# Patient Record
Sex: Female | Born: 1975 | Race: White | Hispanic: No | Smoking: Never smoker
Health system: Southern US, Community
[De-identification: ages and names within clinical notes are randomized; demographics above are authoritative.]

## PROBLEM LIST (undated history)

## (undated) DIAGNOSIS — T7840XA Allergy, unspecified, initial encounter: Secondary | ICD-10-CM

## (undated) DIAGNOSIS — E079 Disorder of thyroid, unspecified: Secondary | ICD-10-CM

## (undated) DIAGNOSIS — F329 Major depressive disorder, single episode, unspecified: Secondary | ICD-10-CM

## (undated) DIAGNOSIS — F32A Depression, unspecified: Secondary | ICD-10-CM

## (undated) DIAGNOSIS — N809 Endometriosis, unspecified: Secondary | ICD-10-CM

## (undated) DIAGNOSIS — M4710 Other spondylosis with myelopathy, site unspecified: Secondary | ICD-10-CM

## (undated) DIAGNOSIS — F419 Anxiety disorder, unspecified: Secondary | ICD-10-CM

## (undated) DIAGNOSIS — G9529 Other cord compression: Secondary | ICD-10-CM

## (undated) DIAGNOSIS — G3189 Other specified degenerative diseases of nervous system: Secondary | ICD-10-CM

## (undated) HISTORY — DX: Anxiety disorder, unspecified: F41.9

## (undated) HISTORY — DX: Endometriosis, unspecified: N80.9

## (undated) HISTORY — DX: Other cord compression: G95.29

## (undated) HISTORY — DX: Other spondylosis with myelopathy, site unspecified: M47.10

## (undated) HISTORY — DX: Other specified degenerative diseases of nervous system: G31.89

## (undated) HISTORY — DX: Major depressive disorder, single episode, unspecified: F32.9

## (undated) HISTORY — DX: Depression, unspecified: F32.A

## (undated) HISTORY — DX: Disorder of thyroid, unspecified: E07.9

## (undated) HISTORY — DX: Allergy, unspecified, initial encounter: T78.40XA

---

## 2011-09-17 ENCOUNTER — Ambulatory Visit: Payer: Self-pay

## 2014-11-05 LAB — HM PAP SMEAR: HM PAP: NEGATIVE

## 2015-04-14 DIAGNOSIS — F329 Major depressive disorder, single episode, unspecified: Secondary | ICD-10-CM | POA: Insufficient documentation

## 2015-04-14 DIAGNOSIS — F410 Panic disorder [episodic paroxysmal anxiety] without agoraphobia: Secondary | ICD-10-CM | POA: Insufficient documentation

## 2016-05-13 DIAGNOSIS — M24852 Other specific joint derangements of left hip, not elsewhere classified: Secondary | ICD-10-CM | POA: Insufficient documentation

## 2017-06-09 ENCOUNTER — Other Ambulatory Visit: Payer: Self-pay | Admitting: Family Medicine

## 2017-06-09 DIAGNOSIS — Z1231 Encounter for screening mammogram for malignant neoplasm of breast: Secondary | ICD-10-CM

## 2017-10-13 ENCOUNTER — Encounter: Payer: Self-pay | Admitting: Physician Assistant

## 2017-10-13 ENCOUNTER — Ambulatory Visit (INDEPENDENT_AMBULATORY_CARE_PROVIDER_SITE_OTHER): Payer: BLUE CROSS/BLUE SHIELD | Admitting: Physician Assistant

## 2017-10-13 VITALS — BP 140/100 | HR 103 | Temp 98.4°F | Resp 16 | Ht 67.0 in | Wt 216.0 lb

## 2017-10-13 DIAGNOSIS — Z713 Dietary counseling and surveillance: Secondary | ICD-10-CM

## 2017-10-13 DIAGNOSIS — Z6833 Body mass index (BMI) 33.0-33.9, adult: Secondary | ICD-10-CM

## 2017-10-13 MED ORDER — PHENTERMINE HCL 37.5 MG PO TABS: 37.5000 mg | ORAL_TABLET | Freq: Every day | ORAL | 0 refills | Status: DC

## 2017-10-13 NOTE — Patient Instructions (Signed)

## 2017-10-13 NOTE — Progress Notes (Signed)
Patient: Casey Carter Female    DOB: 10/14/1975   42 y.o.   MRN: [id number] Visit Date: 10/13/2017  Today's Provider: Margaretann LovelessJennifer M Maiko Salais, PA-C   Chief Complaint  Patient presents with  . Establish Care   Subjective:    HPI Patient is coming in today to establish care and to talk about weight loss.  Obesity: Patient complains of obesity. Patient cites health, increased physical ability, self-image as reasons for wanting to lose weight. She reports that she goes to the gym 3 times a week. Reports that she taken Phentermine in past and it has been very successful. Reports that her diet is overall healthy. Reports that she tried calorie counting and was not successful.     Allergies  Allergen Reactions  . Lavender Oil Rash     Current Outpatient Medications:  .  buPROPion (WELLBUTRIN XL) 150 MG 24 hr tablet, , Disp: , Rfl: 4 .  escitalopram (LEXAPRO) 20 MG tablet, Take by mouth., Disp: , Rfl:  .  omeprazole (PRILOSEC) 40 MG capsule, , Disp: , Rfl: 3  Review of Systems  Constitutional: Positive for fatigue and unexpected weight change.  HENT: Positive for sinus pressure.   Eyes: Negative.   Respiratory: Negative.   Cardiovascular: Negative.   Gastrointestinal: Negative.   Endocrine: Negative.   Genitourinary: Negative.   Musculoskeletal: Negative.   Skin: Negative.   Allergic/Immunologic: Positive for environmental allergies.  Neurological: Positive for headaches.  Psychiatric/Behavioral: Positive for decreased concentration and sleep disturbance.   Depression screen PHQ 2/9 10/13/2017  Decreased Interest 1  Down, Depressed, Hopeless 1  PHQ - 2 Score 2  Altered sleeping 0  Tired, decreased energy 1  Change in appetite 0  Feeling bad or failure about yourself  0  Trouble concentrating 3  Moving slowly or fidgety/restless 0  Suicidal thoughts 0  PHQ-9 Score 6  Difficult doing work/chores Somewhat difficult     Social History   Tobacco Use  .  Smoking status: Never Smoker  . Smokeless tobacco: Never Used  Substance Use Topics  . Alcohol use: Yes    Alcohol/week: 4.2 oz    Types: 7 Glasses of wine per week    Frequency: Never   Objective:   BP (!) 140/100 (BP Location: Left Arm, Patient Position: Sitting, Cuff Size: Large)   Pulse (!) 103   Temp 98.4 F (36.9 C) (Oral)   Resp 16   Ht 5\' 7"  (1.702 m)   Wt 216 lb (98 kg)   SpO2 97%   BMI 33.83 kg/m    Physical Exam  Constitutional: She appears well-developed and well-nourished. No distress.  Neck: Normal range of motion. Neck supple.  Cardiovascular: Normal rate, regular rhythm and normal heart sounds. Exam reveals no gallop and no friction rub.  No murmur heard. Pulmonary/Chest: Effort normal and breath sounds normal. No respiratory distress. She has no wheezes. She has no rales.  Skin: She is not diaphoretic.  Psychiatric: She has a normal mood and affect. Her behavior is normal. Judgment and thought content normal.  Vitals reviewed.      Assessment & Plan:     1. Encounter to establish care  2. Encounter for weight loss counseling Has been trying dieting and slowly adding exercise. Will restart phentermine as below. Advised to start a food diary and try to stay between 1200-1300 calories daily. I will see her back in 4 weeks to check weight. - phentermine (ADIPEX-P) 37.5 MG tablet; Take  1 tablet (37.5 mg total) by mouth daily before breakfast.  Dispense: 30 tablet; Refill: 0  3. BMI 33.0-33.9,adult See above medical treatment plan. - phentermine (ADIPEX-P) 37.5 MG tablet; Take 1 tablet (37.5 mg total) by mouth daily before breakfast.  Dispense: 30 tablet; Refill: 0       Casey Loveless, PA-C  [facility]

## 2017-11-03 ENCOUNTER — Other Ambulatory Visit: Payer: Self-pay | Admitting: Physician Assistant

## 2017-11-03 DIAGNOSIS — Z713 Dietary counseling and surveillance: Secondary | ICD-10-CM

## 2017-11-03 DIAGNOSIS — Z6833 Body mass index (BMI) 33.0-33.9, adult: Secondary | ICD-10-CM

## 2017-11-04 ENCOUNTER — Ambulatory Visit (INDEPENDENT_AMBULATORY_CARE_PROVIDER_SITE_OTHER): Payer: BLUE CROSS/BLUE SHIELD | Admitting: Physician Assistant

## 2017-11-04 ENCOUNTER — Encounter: Payer: Self-pay | Admitting: Physician Assistant

## 2017-11-04 DIAGNOSIS — Z6833 Body mass index (BMI) 33.0-33.9, adult: Secondary | ICD-10-CM | POA: Diagnosis not present

## 2017-11-04 DIAGNOSIS — Z713 Dietary counseling and surveillance: Secondary | ICD-10-CM | POA: Diagnosis not present

## 2017-11-04 DIAGNOSIS — K219 Gastro-esophageal reflux disease without esophagitis: Secondary | ICD-10-CM | POA: Insufficient documentation

## 2017-11-04 MED ORDER — PHENTERMINE HCL 37.5 MG PO TABS: 37.5000 mg | ORAL_TABLET | Freq: Every day | ORAL | 2 refills | Status: DC

## 2017-11-04 NOTE — Progress Notes (Signed)
       Patient: Casey Carter Female    DOB: 03/04/1976   42 y.o.   MRN: [id number] Visit Date: 11/04/2017  Today's Provider: Margaretann LovelessJennifer M Burnette, PA-C   Chief Complaint  Patient presents with  . Follow-up   Subjective:    HPI  Follow up for weight loss counseling  The patient was last seen for this 4 weeks ago. Changes made at last visit include re start Phenternine.  She reports excellent compliance with treatment. She feels that condition is Improved. She is not having side effects.   Current Exercise Habits: Home exercise routine, Type of exercise: Other - see comments, Time (Minutes): 35, Frequency (Times/Week): 2, Weekly Exercise (Minutes/Week): 70, Intensity: Moderate    Wt Readings from Last 3 Encounters:  11/04/17 211 lb (95.7 kg)  10/13/17 216 lb (98 kg)    ------------------------------------------------------------------------------------     Allergies  Allergen Reactions  . Lavender Oil Rash     Current Outpatient Medications:  .  buPROPion (WELLBUTRIN XL) 150 MG 24 hr tablet, , Disp: , Rfl: 4 .  escitalopram (LEXAPRO) 20 MG tablet, Take by mouth., Disp: , Rfl:  .  omeprazole (PRILOSEC) 40 MG capsule, , Disp: , Rfl: 3 .  phentermine (ADIPEX-P) 37.5 MG tablet, Take 1 tablet (37.5 mg total) by mouth daily before breakfast., Disp: 30 tablet, Rfl: 0  Review of Systems  Constitutional: Negative.   Respiratory: Negative.   Cardiovascular: Negative.   Gastrointestinal: Negative.     Social History   Tobacco Use  . Smoking status: Never Smoker  . Smokeless tobacco: Never Used  Substance Use Topics  . Alcohol use: Yes    Alcohol/week: 4.2 oz    Types: 7 Glasses of wine per week    Frequency: Never   Objective:   BP 120/88 (BP Location: Left Arm, Patient Position: Sitting, Cuff Size: Large)   Pulse 93   Temp 98.4 F (36.9 C) (Oral)   Resp 16   Wt 211 lb (95.7 kg)   LMP 11/03/2017 (Exact Date)   SpO2 99%   BMI 33.05 kg/m  Vitals:   11/04/17 0829  BP: 120/88  Pulse: 93  Resp: 16  Temp: 98.4 F (36.9 C)  TempSrc: Oral  SpO2: 99%  Weight: 211 lb (95.7 kg)     Physical Exam  Constitutional: She appears well-developed and well-nourished. No distress.  Neck: Normal range of motion. Neck supple.  Cardiovascular: Normal rate, regular rhythm and normal heart sounds. Exam reveals no gallop and no friction rub.  No murmur heard. Pulmonary/Chest: Effort normal and breath sounds normal. No respiratory distress. She has no wheezes. She has no rales.  Skin: She is not diaphoretic.  Vitals reviewed.       Assessment & Plan:     1. Encounter for weight loss counseling Continue phentermine as below. I will see her back in 3 months for weight recheck. - phentermine (ADIPEX-P) 37.5 MG tablet; Take 1 tablet (37.5 mg total) by mouth daily before breakfast.  Dispense: 30 tablet; Refill: 2  2. BMI 33.0-33.9,adult See above medical treatment plan. - phentermine (ADIPEX-P) 37.5 MG tablet; Take 1 tablet (37.5 mg total) by mouth daily before breakfast.  Dispense: 30 tablet; Refill: 2       Margaretann LovelessJennifer M Burnette, PA-C  [facility]

## 2017-11-09 ENCOUNTER — Encounter: Payer: Self-pay | Admitting: Physician Assistant

## 2017-11-09 ENCOUNTER — Ambulatory Visit: Payer: BLUE CROSS/BLUE SHIELD | Admitting: Physician Assistant

## 2017-11-09 VITALS — BP 122/78 | HR 96 | Temp 98.4°F | Wt 208.2 lb

## 2017-11-09 DIAGNOSIS — E559 Vitamin D deficiency, unspecified: Secondary | ICD-10-CM | POA: Diagnosis not present

## 2017-11-09 DIAGNOSIS — R5383 Other fatigue: Secondary | ICD-10-CM | POA: Diagnosis not present

## 2017-11-09 DIAGNOSIS — K219 Gastro-esophageal reflux disease without esophagitis: Secondary | ICD-10-CM | POA: Diagnosis not present

## 2017-11-09 MED ORDER — OMEPRAZOLE 20 MG PO CPDR: 20.0000 mg | DELAYED_RELEASE_CAPSULE | Freq: Every day | ORAL | 0 refills | Status: DC

## 2017-11-09 MED ORDER — RANITIDINE HCL 150 MG PO TABS: 150.0000 mg | ORAL_TABLET | Freq: Every day | ORAL | 1 refills | Status: DC

## 2017-11-09 NOTE — Patient Instructions (Signed)

## 2017-11-09 NOTE — Progress Notes (Signed)
Patient: Casey Carter Female    DOB: 11/02/1975   42 y.o.   MRN: [id number] Visit Date: 11/09/2017  Today's Provider: Margaretann LovelessJennifer M Erique Kaser, PA-C   Chief Complaint  Patient presents with  . Fatigue   Subjective:    HPI Patient presents today with complaints of feeling fatigue. Patient states this is  recurrent, but worse this time. Patient reports she has a family history of thyroid disease. She also states she had some labs done with [facility] previously and her iron levels were possibly low. She started taking Iron with Vitamin C on Monday and reports she does not feel as fatigue since starting the medication.   She reports [facility] called her and told her she may need to decrease her Omeprazole dose as it may be causing malabsorption.    Allergies  Allergen Reactions  . Lavender Oil Rash     Current Outpatient Medications:  .  buPROPion (WELLBUTRIN XL) 150 MG 24 hr tablet, , Disp: , Rfl: 4 .  escitalopram (LEXAPRO) 20 MG tablet, Take by mouth., Disp: , Rfl:  .  omeprazole (PRILOSEC) 40 MG capsule, , Disp: , Rfl: 3 .  phentermine (ADIPEX-P) 37.5 MG tablet, Take 1 tablet (37.5 mg total) by mouth daily before breakfast., Disp: 30 tablet, Rfl: 2  Review of Systems  Constitutional: Positive for fatigue.  Respiratory: Negative.   Cardiovascular: Negative.     Social History   Tobacco Use  . Smoking status: Never Smoker  . Smokeless tobacco: Never Used  Substance Use Topics  . Alcohol use: Yes    Alcohol/week: 4.2 oz    Types: 7 Glasses of wine per week    Frequency: Never   Objective:   BP 122/78 (BP Location: Left Arm, Patient Position: Sitting, Cuff Size: Normal)   Pulse 96   Temp 98.4 F (36.9 C) (Oral)   Wt 208 lb 3.2 oz (94.4 kg)   LMP 11/03/2017 (Exact Date)   SpO2 98%   BMI 32.61 kg/m  Vitals:   11/09/17 1437  BP: 122/78  Pulse: 96  Temp: 98.4 F (36.9 C)  TempSrc: Oral  SpO2: 98%  Weight: 208 lb 3.2 oz (94.4 kg)     Physical Exam    Constitutional: She appears well-developed and well-nourished. No distress.  Neck: Normal range of motion. Neck supple.  Cardiovascular: Normal rate, regular rhythm and normal heart sounds. Exam reveals no gallop and no friction rub.  No murmur heard. Pulmonary/Chest: Effort normal and breath sounds normal. No respiratory distress. She has no wheezes. She has no rales.  Musculoskeletal: She exhibits no edema.  Skin: She is not diaphoretic.  Vitals reviewed.       Assessment & Plan:     1. Fatigue, unspecified type Unsure of cause but reports some improvement with taking oral iron supplement. I will check labs as below. I will f/u pending labs.  - CBC w/Diff/Platelet - Fe+TIBC+Fer - T4 AND TSH - Vitamin D (25 hydroxy) - B12  2. Gastroesophageal reflux disease without esophagitis Decrease omeprazole to 20mg  daily, then try to go to every other day. Add zantac as below nightly. Try to transition from omeprazole to zantac if possible.  - omeprazole (PRILOSEC) 20 MG capsule; Take 1 capsule (20 mg total) by mouth daily.  Dispense: 90 capsule; Refill: 0 - ranitidine (ZANTAC) 150 MG tablet; Take 1 tablet (150 mg total) by mouth at bedtime.  Dispense: 90 tablet; Refill: 1       Victorino DikeJennifer  Dorothy Puffer, PA-C  [facility]

## 2017-11-10 ENCOUNTER — Telehealth: Payer: Self-pay | Admitting: *Deleted

## 2017-11-10 DIAGNOSIS — E559 Vitamin D deficiency, unspecified: Secondary | ICD-10-CM | POA: Insufficient documentation

## 2017-11-10 LAB — CBC WITH DIFFERENTIAL/PLATELET
BASOS ABS: 0 10*3/uL (ref 0.0–0.2)
Basos: 0 %
EOS (ABSOLUTE): 0.1 10*3/uL (ref 0.0–0.4)
Eos: 1 %
HEMATOCRIT: 39.4 % (ref 34.0–46.6)
Hemoglobin: 13 g/dL (ref 11.1–15.9)
IMMATURE GRANULOCYTES: 0 %
Immature Grans (Abs): 0 10*3/uL (ref 0.0–0.1)
LYMPHS ABS: 1.8 10*3/uL (ref 0.7–3.1)
Lymphs: 21 %
MCH: 31.6 pg (ref 26.6–33.0)
MCHC: 33 g/dL (ref 31.5–35.7)
MCV: 96 fL (ref 79–97)
Monocytes Absolute: 0.5 10*3/uL (ref 0.1–0.9)
Monocytes: 5 %
NEUTROS PCT: 73 %
Neutrophils Absolute: 6 10*3/uL (ref 1.4–7.0)
PLATELETS: 393 10*3/uL — AB (ref 150–379)
RBC: 4.11 x10E6/uL (ref 3.77–5.28)
RDW: 14.7 % (ref 12.3–15.4)
WBC: 8.4 10*3/uL (ref 3.4–10.8)

## 2017-11-10 LAB — IRON,TIBC AND FERRITIN PANEL
Ferritin: 37 ng/mL (ref 15–150)
IRON: 131 ug/dL (ref 27–159)
Iron Saturation: 38 % (ref 15–55)
Total Iron Binding Capacity: 349 ug/dL (ref 250–450)
UIBC: 218 ug/dL (ref 131–425)

## 2017-11-10 LAB — VITAMIN D 25 HYDROXY (VIT D DEFICIENCY, FRACTURES): Vit D, 25-Hydroxy: 8.8 ng/mL — ABNORMAL LOW (ref 30.0–100.0)

## 2017-11-10 LAB — VITAMIN B12: Vitamin B-12: 459 pg/mL (ref 232–1245)

## 2017-11-10 LAB — T4 AND TSH
T4, Total: 5.8 ug/dL (ref 4.5–12.0)
TSH: 2.33 u[IU]/mL (ref 0.450–4.500)

## 2017-11-10 MED ORDER — VITAMIN D (ERGOCALCIFEROL) 1.25 MG (50000 UNIT) PO CAPS: 50000.0000 [IU] | ORAL_CAPSULE | ORAL | 1 refills | Status: DC

## 2017-11-10 NOTE — Addendum Note (Signed)
Addended by: Margaretann LovelessBURNETTE, Shalen Petrak M on: 11/10/2017 09:16 AM   Modules accepted: Orders

## 2017-11-10 NOTE — Telephone Encounter (Signed)
-----   Message from Margaretann LovelessJennifer M Burnette, [place]PA-C sent at 11/10/2017  9:15 AM EDT ----- All labs are normal with excpetion of Vit D which is very low at 8. Will send in high dose Vit D supplement for you.

## 2017-11-10 NOTE — Telephone Encounter (Signed)
LMOVM for pt to return call 

## 2017-11-10 NOTE — Telephone Encounter (Deleted)
Pt returned call. Please advise. Thanks TNP °

## 2017-11-18 NOTE — Telephone Encounter (Signed)
Patient had already been advised.  

## 2017-12-05 ENCOUNTER — Encounter: Payer: Self-pay | Admitting: Family Medicine

## 2017-12-05 ENCOUNTER — Ambulatory Visit: Payer: BLUE CROSS/BLUE SHIELD | Admitting: Family Medicine

## 2017-12-05 VITALS — BP 130/90 | HR 91 | Temp 98.5°F | Resp 16 | Wt 211.2 lb

## 2017-12-05 DIAGNOSIS — J069 Acute upper respiratory infection, unspecified: Secondary | ICD-10-CM

## 2017-12-05 MED ORDER — HYDROCODONE-HOMATROPINE 5-1.5 MG/5ML PO SYRP: 5.0000 mL | ORAL_SOLUTION | Freq: Four times a day (QID) | ORAL | 0 refills | Status: AC | PRN

## 2017-12-05 NOTE — Progress Notes (Signed)
  Subjective:     Patient ID: Casey Carter, female   DOB: [birth year], 42 y.o.   MRN: [id number] Chief Complaint  Patient presents with  . Cough    Patient comes in office today with complaints of productive cough that began a week ago. Patient reports that cough is productive of green phlegm and she complains of tightness in chest. Patient states associated with cough she has had fatigue, difficulty sleeping at night do to cough, left ear pressure and redness of both eyes. Patient has been taking otc Mucinex D, Zycam and Airborne.    HPI States he daughter has been sick as well. No fever reported but has had chills. Reports clear sinus congestion.  Review of Systems     Objective:   Physical Exam  Constitutional: She appears well-developed and well-nourished. No distress.  Ears: T.M's intact without inflammation Sinuses: non-tender Throat: no tonsillar enlargement or exudate Neck: no cervical adenopathy Lungs: clear     Assessment:    1. Viral upper respiratory tract infection: rx for Hycodan cough syrup sent in.     Plan:    Discussed continued use of otc medication. Work excuse for today.

## 2017-12-05 NOTE — Patient Instructions (Addendum)
Continue Mucinex and Decongestants like Sudafed. May use Delsym for cough. Call if sputum not clearing by Friday.

## 2018-01-23 ENCOUNTER — Other Ambulatory Visit: Payer: Self-pay | Admitting: Physician Assistant

## 2018-01-23 DIAGNOSIS — Z713 Dietary counseling and surveillance: Secondary | ICD-10-CM

## 2018-01-23 DIAGNOSIS — Z6833 Body mass index (BMI) 33.0-33.9, adult: Secondary | ICD-10-CM

## 2018-02-03 ENCOUNTER — Ambulatory Visit: Payer: BLUE CROSS/BLUE SHIELD | Admitting: Physician Assistant

## 2018-02-03 ENCOUNTER — Encounter: Payer: Self-pay | Admitting: Physician Assistant

## 2018-02-03 VITALS — BP 160/100 | HR 108 | Temp 98.0°F | Resp 16 | Ht 67.0 in | Wt 216.0 lb

## 2018-02-03 DIAGNOSIS — F329 Major depressive disorder, single episode, unspecified: Secondary | ICD-10-CM

## 2018-02-03 DIAGNOSIS — K219 Gastro-esophageal reflux disease without esophagitis: Secondary | ICD-10-CM | POA: Diagnosis not present

## 2018-02-03 DIAGNOSIS — E6609 Other obesity due to excess calories: Secondary | ICD-10-CM

## 2018-02-03 DIAGNOSIS — M62838 Other muscle spasm: Secondary | ICD-10-CM

## 2018-02-03 DIAGNOSIS — Z6833 Body mass index (BMI) 33.0-33.9, adult: Secondary | ICD-10-CM

## 2018-02-03 DIAGNOSIS — G9529 Other cord compression: Secondary | ICD-10-CM

## 2018-02-03 DIAGNOSIS — M4710 Other spondylosis with myelopathy, site unspecified: Secondary | ICD-10-CM

## 2018-02-03 DIAGNOSIS — F32A Depression, unspecified: Secondary | ICD-10-CM

## 2018-02-03 DIAGNOSIS — G3189 Other specified degenerative diseases of nervous system: Secondary | ICD-10-CM

## 2018-02-03 MED ORDER — LORCASERIN HCL 10 MG PO TABS: 10.0000 mg | ORAL_TABLET | Freq: Two times a day (BID) | ORAL | 0 refills | Status: DC

## 2018-02-03 MED ORDER — METHOCARBAMOL 500 MG PO TABS: 500.0000 mg | ORAL_TABLET | Freq: Three times a day (TID) | ORAL | 1 refills | Status: DC | PRN

## 2018-02-03 MED ORDER — ESCITALOPRAM OXALATE 20 MG PO TABS: 20.0000 mg | ORAL_TABLET | Freq: Every day | ORAL | 1 refills | Status: DC

## 2018-02-03 MED ORDER — PANTOPRAZOLE SODIUM 40 MG PO TBEC: 40.0000 mg | DELAYED_RELEASE_TABLET | Freq: Every day | ORAL | 1 refills | Status: DC

## 2018-02-03 MED ORDER — BUPROPION HCL ER (XL) 150 MG PO TB24: 150.0000 mg | ORAL_TABLET | Freq: Every day | ORAL | 1 refills | Status: DC

## 2018-02-03 NOTE — Progress Notes (Signed)
Patient: Casey Carter Female    DOB: 1976-07-12   42 y.o.   MRN: [id number] Visit Date: 02/03/2018  Today's Provider: Margaretann Loveless, PA-C   Chief Complaint  Patient presents with  . Follow-up    Weight Loss Counseling   Subjective:    HPI  Weight Loss Counseling, Follow up:  The patient was last seen for weight loss counseling 3 months ago. Changes made since that visit include none, continue Phentermine. Feels like is not helping with her appetite but it gives her energy.  She reports excellent compliance with treatment. She is not having side effects.   She is trying to exercising. She reports that she has DDD and it limits her on what to do. ------------------------------------------------------------------------   GERD, Follow up: Patient is currently taking Zantac and Omeprazole and reports medications are not helping at all.  She reports excellent compliance with treatment. She is not having side effects.  She IS experiencing cough, deep pressure at base of neck, fullness after meals, heartburn, need to clear throat frequently and nocturnal burning. Has a lot of stress with work. She is an Occupational hygienist.  ------------------------------------------------------------------------    Allergies  Allergen Reactions  . Lavender Oil Rash     Current Outpatient Medications:  .  buPROPion (WELLBUTRIN XL) 150 MG 24 hr tablet, , Disp: , Rfl: 4 .  escitalopram (LEXAPRO) 20 MG tablet, Take by mouth., Disp: , Rfl:  .  omeprazole (PRILOSEC) 20 MG capsule, Take 1 capsule (20 mg total) by mouth daily., Disp: 90 capsule, Rfl: 0 .  ranitidine (ZANTAC) 150 MG tablet, Take 1 tablet (150 mg total) by mouth at bedtime., Disp: 90 tablet, Rfl: 1 .  Vitamin D, Ergocalciferol, (DRISDOL) 50000 units CAPS capsule, Take 1 capsule (50,000 Units total) by mouth every 7 (seven) days., Disp: 12 capsule, Rfl: 1 .  phentermine (ADIPEX-P) 37.5 MG tablet, Take 1 tablet (37.5 mg total)  by mouth daily before breakfast. (Patient not taking: Reported on 02/03/2018), Disp: 30 tablet, Rfl: 2  Review of Systems  Constitutional: Positive for fatigue.  Respiratory: Negative.   Cardiovascular: Negative.   Gastrointestinal: Positive for abdominal distention and nausea.  Neurological: Negative.   Psychiatric/Behavioral: Positive for agitation and dysphoric mood. The patient is nervous/anxious.     Social History   Tobacco Use  . Smoking status: Never Smoker  . Smokeless tobacco: Never Used  Substance Use Topics  . Alcohol use: Yes    Alcohol/week: 4.2 oz    Types: 7 Glasses of wine per week    Frequency: Never   Objective:   BP (!) 160/100 (BP Location: Left Arm, Patient Position: Sitting, Cuff Size: Large)   Pulse (!) 108   Temp 98 F (36.7 C) (Oral)   Resp 16   Ht 5\' 7"  (1.702 m)   Wt 216 lb (98 kg)   LMP 01/20/2018 Comment: really heavy first 2 days then spotting. On the first day she went through a whole box of tampons.  SpO2 98%   BMI 33.83 kg/m  Vitals:   02/03/18 0900  BP: (!) 160/100  Pulse: (!) 108  Resp: 16  Temp: 98 F (36.7 C)  TempSrc: Oral  SpO2: 98%  Weight: 216 lb (98 kg)  Height: 5\' 7"  (1.702 m)     Physical Exam  Constitutional: She appears well-developed and well-nourished. No distress.  Neck: Normal range of motion. Neck supple.  Cardiovascular: Normal rate, regular rhythm and normal heart sounds.  Exam reveals no gallop and no friction rub.  No murmur heard. Pulmonary/Chest: Effort normal and breath sounds normal. No respiratory distress. She has no wheezes. She has no rales.  Abdominal: Soft. Bowel sounds are normal. She exhibits no distension and no mass. There is tenderness in the epigastric area. There is no guarding.  Skin: She is not diaphoretic.  Psychiatric: Her speech is normal and behavior is normal. Judgment and thought content normal. Her mood appears anxious. Cognition and memory are normal.  Vitals reviewed.        Assessment & Plan:     1. Class 1 obesity due to excess calories without serious comorbidity with body mass index (BMI) of 33.0 to 33.9 in adult Failed phentermine x 3 months. Will try Belviq as below. Contrave contraindicated due to patient already being on wellbutrin and lexapro for depression and anxiety.Disucssed food diary, meal prepping and increasing physical activity.  I will see her back in 3 months.  - Lorcaserin HCl 10 MG TABS; Take 10 mg by mouth 2 (two) times daily.  Dispense: 60 tablet; Refill: 0  2. Gastroesophageal reflux disease without esophagitis Worsening. Will change omeprazole to pantoprazole. Continue zantac BID prn. I will see her back in 3 months.  - pantoprazole (PROTONIX) 40 MG tablet; Take 1 tablet (40 mg total) by mouth daily.  Dispense: 90 tablet; Refill: 1  3. Acute depression Stable. Diagnosis pulled for medication refill. Continue current medical treatment plan. - escitalopram (LEXAPRO) 20 MG tablet; Take 1 tablet (20 mg total) by mouth daily.  Dispense: 90 tablet; Refill: 1 - buPROPion (WELLBUTRIN XL) 150 MG 24 hr tablet; Take 1 tablet (150 mg total) by mouth daily.  Dispense: 90 tablet; Refill: 1  4. Muscle spasm Secondary to DDD. Patient reports trying PT without relief. Also patient reports being referred to spinal specialist, waited in waiting room for over and hour and a half without being seen so she left. Will give muscle relaxer for occasional muscle spasm as below. Call if worsening and will refer.  - methocarbamol (ROBAXIN) 500 MG tablet; Take 1 tablet (500 mg total) by mouth every 8 (eight) hours as needed for muscle spasms.  Dispense: 90 tablet; Refill: 1  5. 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 - methocarbamol (ROBAXIN) 500 MG tablet; Take 1 tablet (500 mg total) by mouth every 8 (eight) hours as needed for muscle spasms.  Dispense: 90 tablet; Refill: 1  I spent  approximately 40 minutes with the patient today. Over 50% of this time was spent with counseling and educating the patient.       Margaretann LovelessJennifer M Ysidro Ramsay, PA-C  [facility]

## 2018-02-03 NOTE — Patient Instructions (Signed)
Pantoprazole tablets What is this medicine? PANTOPRAZOLE (pan TOE pra zole) prevents the production of acid in the stomach. It is used to treat gastroesophageal reflux disease (GERD), inflammation of the esophagus, and Zollinger-Ellison syndrome. This medicine may be used for other purposes; ask your health care provider or pharmacist if you have questions. COMMON BRAND NAME(S): Protonix What should I tell my health care provider before I take this medicine? They need to know if you have any of these conditions: -liver disease -low levels of magnesium in the blood -lupus -an unusual or allergic reaction to omeprazole, lansoprazole, pantoprazole, rabeprazole, other medicines, foods, dyes, or preservatives -pregnant or trying to get pregnant -breast-feeding How should I use this medicine? Take this medicine by mouth. Swallow the tablets whole with a drink of water. Follow the directions on the prescription label. Do not crush, break, or chew. Take your medicine at regular intervals. Do not take your medicine more often than directed. Talk to your pediatrician regarding the use of this medicine in children. While this drug may be prescribed for children as young as 5 years for selected conditions, precautions do apply. Overdosage: If you think you have taken too much of this medicine contact a poison control center or emergency room at once. NOTE: This medicine is only for you. Do not share this medicine with others. What if I miss a dose? If you miss a dose, take it as soon as you can. If it is almost time for your next dose, take only that dose. Do not take double or extra doses. What may interact with this medicine? Do not take this medicine with any of the following medications: -atazanavir -nelfinavir This medicine may also interact with the following medications: -ampicillin -delavirdine -erlotinib -iron salts -medicines for fungal infections like ketoconazole, itraconazole and  voriconazole -methotrexate -mycophenolate mofetil -warfarin This list may not describe all possible interactions. Give your health care provider a list of all the medicines, herbs, non-prescription drugs, or dietary supplements you use. Also tell them if you smoke, drink alcohol, or use illegal drugs. Some items may interact with your medicine. What should I watch for while using this medicine? It can take several days before your stomach pain gets better. Check with your doctor or health care professional if your condition does not start to get better, or if it gets worse. You may need blood work done while you are taking this medicine. What side effects may I notice from receiving this medicine? Side effects that you should report to your doctor or health care professional as soon as possible: -allergic reactions like skin rash, itching or hives, swelling of the face, lips, or tongue -bone, muscle or joint pain -breathing problems -chest pain or chest tightness -dark yellow or brown urine -dizziness -fast, irregular heartbeat -feeling faint or lightheaded -fever or sore throat -muscle spasm -palpitations -rash on cheeks or arms that gets worse in the sun -redness, blistering, peeling or loosening of the skin, including inside the mouth -seizures -tremors -unusual bleeding or bruising -unusually weak or tired -yellowing of the eyes or skin Side effects that usually do not require medical attention (report to your doctor or health care professional if they continue or are bothersome): -constipation -diarrhea -dry mouth -headache -nausea This list may not describe all possible side effects. Call your doctor for medical advice about side effects. You may report side effects to FDA at 1-800-FDA-1088. Where should I keep my medicine? Keep out of the reach of children. Store at room  temperature between 15 and 30 degrees C (59 and 86 degrees F). Protect from light and moisture. Throw  away any unused medicine after the expiration date. NOTE: This sheet is a summary. It may not cover all possible information. If you have questions about this medicine, talk to your doctor, pharmacist, or health care provider.  2018 Elsevier/Gold Standard (2015-08-14 12:20:19) Lorcaserin oral tablets What is this medicine? LORCASERIN (lor ca SER in) is used to promote and maintain weight loss in obese patients. This medicine should be used with a reduced calorie diet and, if appropriate, an exercise program. This medicine may be used for other purposes; ask your health care provider or pharmacist if you have questions. COMMON BRAND NAME(S): Belviq What should I tell my health care provider before I take this medicine? They need to know if you have any of these conditions: -anatomical deformation of the penis, Peyronie's disease, or history of priapism (painful and prolonged erection) -diabetes -heart disease -history of blood diseases, like sickle cell anemia or leukemia -history of irregular heartbeat -kidney disease -liver disease -suicidal thoughts, plans, or attempt; a previous suicide attempt by you or a family member -an unusual or allergic reaction to lorcaserin, other medicines, foods, dyes, or preservatives -pregnant or trying to get pregnant -breast-feeding How should I use this medicine? Take this medicine by mouth with a glass of water. Follow the directions on the prescription label. You can take it with or without food. Take your medicine at regular intervals. Do not take it more often than directed. Do not stop taking except on your doctor's advice. Talk to your pediatrician regarding the use of this medicine in children. Special care may be needed. Overdosage: If you think you have taken too much of this medicine contact a poison control center or emergency room at once. NOTE: This medicine is only for you. Do not share this medicine with others. What if I miss a dose? If  you miss a dose, take it as soon as you can. If it is almost time for your next dose, take only that dose. Do not take double or extra doses. What may interact with this medicine? -cabergoline -certain medicines for depression, anxiety, or psychotic disturbances -certain medicines for erectile dysfunction -certain medicines for migraine headache like almotriptan, eletriptan, frovatriptan, naratriptan, rizatriptan, sumatriptan, zolmitriptan -dextromethorphan -linezolid -lithium -medicines for diabetes -other weight loss products -tramadol -St. John's Wort -stimulant medicines for attention disorders, weight loss, or to stay awake -tryptophan This list may not describe all possible interactions. Give your health care provider a list of all the medicines, herbs, non-prescription drugs, or dietary supplements you use. Also tell them if you smoke, drink alcohol, or use illegal drugs. Some items may interact with your medicine. What should I watch for while using this medicine? This medicine is intended to be used in addition to a healthy diet and appropriate exercise. The best results are achieved this way. Your doctor should instruct you to stop taking this medicine if you do not lose a certain amount of weight within the first 12 weeks of treatment, but it is important that you do not change your dose in any way without consulting your doctor or health care professional. Visit your doctor or health care professional for regular checkups. Your doctor may order blood tests or other tests to see how you are doing. Do not drive, use machinery, or do anything that needs mental alertness until you know how this medicine affects you. This medicine may affect  blood sugar levels. If you have diabetes, check with your doctor or health care professional before you change your diet or the dose of your diabetic medicine. Patients and their families should watch out for worsening depression or thoughts of  suicide. Also watch out for sudden changes in feelings such as feeling anxious, agitated, panicky, irritable, hostile, aggressive, impulsive, severely restless, overly excited and hyperactive, or not being able to sleep. If this happens, especially at the beginning of treatment or after a change in dose, call your health care professional. Contact your doctor or health care professional right away if you are a man with an erection that lasts longer than 4 hours or if the erection becomes painful. This may be a sign of serious problem and must be treated right away to prevent permanent damage. What side effects may I notice from receiving this medicine? Side effects that you should report to your doctor or health care professional as soon as possible: -allergic reactions like skin rash, itching or hives, swelling of the face, lips, or tongue -abnormal production of milk -breast enlargement in both males and females -breathing problems -changes in emotions or moods -changes in vision -confusion -erection lasting more than 4 hours or a painful erection -fast or irregular heart beat -feeling faint or lightheaded, falls -fever or chills, sore throat -hallucination, loss of contact with reality -high or low blood pressure -menstrual changes -restlessness -slow or irregular heartbeat -stiff muscles -sweating -suicidal thoughts or other mood changes -swelling of the ankles, feet, hands -unusually weak or tired -vomiting Side effects that usually do not require medical attention (report to your doctor or health care professional if they continue or are bothersome): -back pain -constipation -cough -dry mouth -nausea -tiredness This list may not describe all possible side effects. Call your doctor for medical advice about side effects. You may report side effects to FDA at 1-800-FDA-1088. Where should I keep my medicine? Keep out of the reach of children. This medicine can be abused. Keep  your medicine in a safe place to protect it from theft. Do not share this medicine with anyone. Selling or giving away this medicine is dangerous and against the law. Store at room temperature between 15 and 30 degrees C (59 and 86 degrees F). Throw away any unused medicine after the expiration date. NOTE: This sheet is a summary. It may not cover all possible information. If you have questions about this medicine, talk to your doctor, pharmacist, or health care provider.  2018 Elsevier/Gold Standard (2015-08-14 12:13:31)

## 2018-03-15 ENCOUNTER — Ambulatory Visit: Payer: BLUE CROSS/BLUE SHIELD | Admitting: Family Medicine

## 2018-03-15 ENCOUNTER — Encounter: Payer: Self-pay | Admitting: Family Medicine

## 2018-03-15 VITALS — BP 140/91 | HR 110 | Temp 98.4°F | Resp 16 | Wt 211.0 lb

## 2018-03-15 DIAGNOSIS — S300XXA Contusion of lower back and pelvis, initial encounter: Secondary | ICD-10-CM

## 2018-03-15 MED ORDER — PREDNISONE 20 MG PO TABS: 20.0000 mg | ORAL_TABLET | Freq: Two times a day (BID) | ORAL | 0 refills | Status: AC

## 2018-03-15 MED ORDER — HYDROCODONE-ACETAMINOPHEN 7.5-325 MG PO TABS: 1.0000 | ORAL_TABLET | Freq: Four times a day (QID) | ORAL | 0 refills | Status: DC | PRN

## 2018-03-15 MED ORDER — HYDROCODONE-ACETAMINOPHEN 7.5-325 MG PO TABS
1.0000 | ORAL_TABLET | Freq: Four times a day (QID) | ORAL | 0 refills | Status: AC | PRN
Start: 2018-03-15 — End: 2018-03-20

## 2018-03-15 NOTE — Progress Notes (Signed)
Patient: Casey Carter Female    DOB: 08/13/1975   42 y.o.   MRN: [id number] Visit Date: 03/15/2018  Today's Provider: Mila Merryonald Revonda Menter, MD   Chief Complaint  Patient presents with  . Back Pain   Subjective:    HPI  Patient states she fell backswords yesterday and landed on an entertainment center. Patient tripped over another piece of furniture causing her to fall. Patient states when she fell she hit the lower region of her back. Patient states she is sore in back and neck. Hurt a little bit right after accident, but much worse overnight. Patient states she has been taking her prescribed muscle relaxer but it is not helping with the pain. She has also been taking 4 x 200mg  ibuprofen which is starting to upset her stomach. She has been alternating applications of heat and ice without improvement. No numbness tingling, or weakness of LEs.    Allergies  Allergen Reactions  . Lavender Oil Rash     Current Outpatient Medications:  .  buPROPion (WELLBUTRIN XL) 150 MG 24 hr tablet, Take 1 tablet (150 mg total) by mouth daily., Disp: 90 tablet, Rfl: 1 .  escitalopram (LEXAPRO) 20 MG tablet, Take 1 tablet (20 mg total) by mouth daily., Disp: 90 tablet, Rfl: 1 .  methocarbamol (ROBAXIN) 500 MG tablet, Take 1 tablet (500 mg total) by mouth every 8 (eight) hours as needed for muscle spasms., Disp: 90 tablet, Rfl: 1 .  pantoprazole (PROTONIX) 40 MG tablet, Take 1 tablet (40 mg total) by mouth daily., Disp: 90 tablet, Rfl: 1 .  ranitidine (ZANTAC) 150 MG tablet, Take 1 tablet (150 mg total) by mouth at bedtime., Disp: 90 tablet, Rfl: 1 .  Vitamin D, Ergocalciferol, (DRISDOL) 50000 units CAPS capsule, Take 1 capsule (50,000 Units total) by mouth every 7 (seven) days., Disp: 12 capsule, Rfl: 1 .  Lorcaserin HCl 10 MG TABS, Take 10 mg by mouth 2 (two) times daily. (Patient not taking: Reported on 03/15/2018), Disp: 60 tablet, Rfl: 0  Review of Systems  Constitutional: Negative for appetite  change, chills, fatigue and fever.  Respiratory: Negative for chest tightness and shortness of breath.   Cardiovascular: Negative for chest pain and palpitations.  Gastrointestinal: Negative for abdominal pain, nausea and vomiting.  Musculoskeletal: Positive for back pain and neck pain.  Neurological: Negative for dizziness and weakness.    Social History   Tobacco Use  . Smoking status: Never Smoker  . Smokeless tobacco: Never Used  Substance Use Topics  . Alcohol use: Yes    Alcohol/week: 7.0 standard drinks    Types: 7 Glasses of wine per week    Frequency: Never   Objective:   BP (!) 140/91 (BP Location: Right Arm, Patient Position: Sitting, Cuff Size: Large)   Pulse (!) 110   Temp 98.4 F (36.9 C) (Oral)   Resp 16   Wt 211 lb (95.7 kg)   SpO2 98%   BMI 33.05 kg/m  Vitals:   03/15/18 1528  BP: (!) 140/91  Pulse: (!) 110  Resp: 16  Temp: 98.4 F (36.9 C)  TempSrc: Oral  SpO2: 98%  Weight: 211 lb (95.7 kg)     Physical Exam  General appearance: alert, well developed, well nourished, cooperative and in no distress Head: Normocephalic, without obvious abnormality, atraumatic Respiratory: Respirations even and unlabored, normal respiratory rate Extremities: Very tender over sacrum an iliac spine. No gross deformities. No LE weakness or other neuro deficits. Able  to stand and ambulate without assistance, but in some pain      Assessment & Plan:     1. Contusion of sacrococcygeal region, initial encounter She is not tolerating high doses of NSAIDs, will start prednisone and hydrocodone/apap prn. Counseled to ice frequently over the next 48 hours, then alternate heat and ice. Use soft cushions to sit on. Expect to get worse before getting better, but should start to improve in 2-3 days and should then improve steadily over the next 3-4 weeks. Call or ROV otherwise.        Mila Merryonald Adrina Armijo, MD  [facility]

## 2018-03-16 ENCOUNTER — Ambulatory Visit: Payer: BLUE CROSS/BLUE SHIELD | Admitting: Physician Assistant

## 2018-03-28 ENCOUNTER — Ambulatory Visit: Payer: BLUE CROSS/BLUE SHIELD | Admitting: Physician Assistant

## 2018-03-28 ENCOUNTER — Encounter: Payer: Self-pay | Admitting: Physician Assistant

## 2018-03-28 ENCOUNTER — Ambulatory Visit
Admission: RE | Admit: 2018-03-28 | Discharge: 2018-03-28 | Disposition: A | Discharge status: Home / Self Care | Payer: BLUE CROSS/BLUE SHIELD | Source: Ambulatory Visit | Attending: Physician Assistant | Admitting: Physician Assistant

## 2018-03-28 VITALS — BP 150/90 | HR 98 | Temp 98.4°F | Resp 16 | Wt 208.2 lb

## 2018-03-28 DIAGNOSIS — M25552 Pain in left hip: Secondary | ICD-10-CM | POA: Diagnosis not present

## 2018-03-28 DIAGNOSIS — M5416 Radiculopathy, lumbar region: Secondary | ICD-10-CM | POA: Diagnosis present

## 2018-03-28 DIAGNOSIS — M4726 Other spondylosis with radiculopathy, lumbar region: Secondary | ICD-10-CM | POA: Diagnosis not present

## 2018-03-28 MED ORDER — OXYCODONE-ACETAMINOPHEN 5-325 MG PO TABS: 1.0000 | ORAL_TABLET | Freq: Three times a day (TID) | ORAL | 0 refills | Status: DC | PRN

## 2018-03-28 NOTE — Progress Notes (Signed)
Patient: Casey Carter Female    DOB: April 27, 1976   42 y.o.   MRN: [id number] Visit Date: 03/28/2018  Today's Provider: Margaretann Loveless, PA-C   No chief complaint on file.  Subjective:    Back Pain  This is a chronic problem. The problem occurs constantly. The pain is present in the lumbar spine (Lower back). The quality of the pain is described as aching and cramping. Radiates to: Radiates down on her legs more to the left side. Pain scale: Depends on the day and movements. The pain is moderate. Exacerbated by: "Movement" Stiffness is present in the morning. Associated symptoms include leg pain. Pertinent negatives include no bladder incontinence, bowel incontinence, numbness, perianal numbness, tingling or weakness. She has tried NSAIDs for the symptoms. The treatment provided no relief.   Depression: she reports that for the last couple of weeks she feels she just doesn't want to get up from bed. She reports this is worsening. She feeling tired. She reports that she feels "out of it" when moving around. She reports that she is sleeping well. She reports that she didn't go to work today. She reports that Belviq on Thursday on last week.      Allergies  Allergen Reactions  . Lavender Oil Rash     Current Outpatient Medications:  .  buPROPion (WELLBUTRIN XL) 150 MG 24 hr tablet, Take 1 tablet (150 mg total) by mouth daily., Disp: 90 tablet, Rfl: 1 .  escitalopram (LEXAPRO) 20 MG tablet, Take 1 tablet (20 mg total) by mouth daily., Disp: 90 tablet, Rfl: 1 .  methocarbamol (ROBAXIN) 500 MG tablet, Take 1 tablet (500 mg total) by mouth every 8 (eight) hours as needed for muscle spasms., Disp: 90 tablet, Rfl: 1 .  pantoprazole (PROTONIX) 40 MG tablet, Take 1 tablet (40 mg total) by mouth daily., Disp: 90 tablet, Rfl: 1 .  ranitidine (ZANTAC) 150 MG tablet, Take 1 tablet (150 mg total) by mouth at bedtime., Disp: 90 tablet, Rfl: 1 .  Vitamin D, Ergocalciferol, (DRISDOL) 50000  units CAPS capsule, Take 1 capsule (50,000 Units total) by mouth every 7 (seven) days., Disp: 12 capsule, Rfl: 1 .  Lorcaserin HCl 10 MG TABS, Take 10 mg by mouth 2 (two) times daily. (Patient not taking: Reported on 03/15/2018), Disp: 60 tablet, Rfl: 0  Review of Systems  Constitutional: Negative.   Respiratory: Negative.   Cardiovascular: Negative.   Gastrointestinal: Negative for bowel incontinence.  Genitourinary: Negative for bladder incontinence.  Musculoskeletal: Positive for back pain.  Neurological: Negative for tingling, weakness and numbness.  Psychiatric/Behavioral: Positive for agitation and dysphoric mood. The patient is nervous/anxious.     Social History   Tobacco Use  . Smoking status: Never Smoker  . Smokeless tobacco: Never Used  Substance Use Topics  . Alcohol use: Yes    Alcohol/week: 7.0 standard drinks    Types: 7 Glasses of wine per week    Frequency: Never   Objective:   BP (!) 150/90 (BP Location: Left Arm, Patient Position: Sitting, Cuff Size: Large)   Pulse 98   Temp 98.4 F (36.9 C) (Oral)   Resp 16   Wt 208 lb 3.2 oz (94.4 kg)   SpO2 98%   BMI 32.61 kg/m  Vitals:   03/28/18 1532  BP: (!) 150/90  Pulse: 98  Resp: 16  Temp: 98.4 F (36.9 C)  TempSrc: Oral  SpO2: 98%  Weight: 208 lb 3.2 oz (94.4 kg)  Physical Exam  Constitutional: She appears well-developed and well-nourished. No distress.  Neck: Normal range of motion. Neck supple.  Cardiovascular: Normal rate, regular rhythm and normal heart sounds. Exam reveals no gallop and no friction rub.  No murmur heard. Pulmonary/Chest: Effort normal and breath sounds normal. No respiratory distress. She has no wheezes. She has no rales.  Musculoskeletal:       Lumbar back: She exhibits decreased range of motion and tenderness. She exhibits no bony tenderness, no swelling and no spasm.  Negative SLR bilat  Skin: She is not diaphoretic.  Psychiatric: Her speech is normal and behavior is  normal. Judgment and thought content normal. Her mood appears anxious. Cognition and memory are normal. She exhibits a depressed mood.  Vitals reviewed.     Office Visit from 03/28/2018 in [facility]  PHQ-9 Total Score  12        Assessment & Plan:     1. Lumbar radiculopathy Will get new xays as she has not had any images done locally and none since 2015-2016 (patient report, no records). She reports previously being told she had DDD and completed PT without relief. I will give her one prescription of percocet as below for pain control. May consider referral to ortho spine pending xray results. I will f/u pending results.  - DG Lumbar Spine Complete; Future - oxyCODONE-acetaminophen (PERCOCET/ROXICET) 5-325 MG tablet; Take 1 tablet by mouth every 8 (eight) hours as needed for severe pain.  Dispense: 90 tablet; Refill: 0 - DG HIP UNILAT W OR W/O PELVIS 2-3 VIEWS LEFT; Future  2. Left hip pain Suspect hip pain to be from bursitis or radiculopathy radiating around the girdle but will xray hip to r/o bony cause. I will f/u pending results.  - DG HIP UNILAT W OR W/O PELVIS 2-3 VIEWS LEFT; Future       Margaretann Loveless, PA-C  [facility]

## 2018-03-30 ENCOUNTER — Telehealth: Payer: Self-pay

## 2018-03-30 DIAGNOSIS — G3189 Other specified degenerative diseases of nervous system: Secondary | ICD-10-CM

## 2018-03-30 DIAGNOSIS — M4710 Other spondylosis with myelopathy, site unspecified: Secondary | ICD-10-CM

## 2018-03-30 DIAGNOSIS — M5416 Radiculopathy, lumbar region: Secondary | ICD-10-CM

## 2018-03-30 DIAGNOSIS — G9529 Other cord compression: Secondary | ICD-10-CM

## 2018-03-30 NOTE — Telephone Encounter (Signed)
Patient advised as below.  

## 2018-03-30 NOTE — Telephone Encounter (Signed)
-----   Message from Margaretann Loveless, PA-C sent at 03/29/2018  9:50 PM EDT ----- Left hip xray normal.

## 2018-03-30 NOTE — Telephone Encounter (Signed)
-----   Message from Margaretann Loveless, [place] sent at 03/29/2018  9:51 PM EDT ----- Lumbar spine xray does show degenerative disc disease with arthritic changes. If desired I would recommend to proceed with referral to orthopedic spine specialist

## 2018-04-03 NOTE — Telephone Encounter (Signed)
Referral placed.

## 2018-04-04 ENCOUNTER — Encounter: Payer: Self-pay | Admitting: Physician Assistant

## 2018-04-20 ENCOUNTER — Ambulatory Visit (INDEPENDENT_AMBULATORY_CARE_PROVIDER_SITE_OTHER): Payer: Self-pay | Admitting: Orthopaedic Surgery

## 2018-04-26 ENCOUNTER — Other Ambulatory Visit: Payer: Self-pay | Admitting: Physician Assistant

## 2018-04-26 DIAGNOSIS — M5416 Radiculopathy, lumbar region: Secondary | ICD-10-CM

## 2018-04-26 NOTE — Telephone Encounter (Signed)
Pt needing a refill on:  oxyCODONE-acetaminophen (PERCOCET/ROXICET) 5-325 MG tablet  Pt wanting to fill at new pharmacy due to cost of last refill.  Please fill at:  [facility] [address] [place], [place] [address] Phone: [phone]   Thanks, [facility]

## 2018-04-27 MED ORDER — OXYCODONE-ACETAMINOPHEN 5-325 MG PO TABS: 1.0000 | ORAL_TABLET | Freq: Two times a day (BID) | ORAL | 0 refills | Status: DC | PRN

## 2018-04-27 NOTE — Telephone Encounter (Signed)
Patient advised as directed below.  Thanks,  -Joseline 

## 2018-04-27 NOTE — Telephone Encounter (Signed)
Refilled. Cut back to every 12 hours use since she is seeing ortho next week.

## 2018-04-27 NOTE — Telephone Encounter (Signed)
Pt called back to see if her rx has been sent to the pharmacy  Thanks teri

## 2018-05-01 ENCOUNTER — Encounter (INDEPENDENT_AMBULATORY_CARE_PROVIDER_SITE_OTHER): Payer: Self-pay | Admitting: Orthopaedic Surgery

## 2018-05-01 ENCOUNTER — Ambulatory Visit (INDEPENDENT_AMBULATORY_CARE_PROVIDER_SITE_OTHER): Payer: BLUE CROSS/BLUE SHIELD | Admitting: Orthopaedic Surgery

## 2018-05-01 DIAGNOSIS — M4316 Spondylolisthesis, lumbar region: Secondary | ICD-10-CM

## 2018-05-01 NOTE — Progress Notes (Signed)
Office Visit Note   Patient: Casey Carter           Date of Birth: Jan 25, 1976           MRN: [id number] Visit Date: 05/01/2018              Requested by: Margaretann Loveless, PA-C [address] [place], [place] [address] PCP: Margaretann Loveless, PA-C   Assessment & Plan: Visit Diagnoses:  1. Spondylolisthesis of lumbar region     Plan: Chronic, recurrent low back pain over several years.  Has tried chiropractic treatment, physical therapy and meds.  I think it is time to obtain an MRI scan  Follow-Up Instructions: Return after MRI L-S spine.   Orders:  Orders Placed This Encounter  Procedures  . MR Lumbar Spine w/o contrast   No orders of the defined types were placed in this encounter.     Procedures: No procedures performed   Clinical Data: No additional findings.   Subjective: Chief Complaint  Patient presents with  . New Patient (Initial Visit)    BACK PAIN 2-3 YRS AGO HAD INJURY 04/2016 AND WAS DDD, DID PT, AND CIRO. SEEN DR Doristine Counter 03/28/2018 HAD X RAYS DONE NO INJECTIONS. AT TIMES PAIN GETS SO BAD. UNABLE TO WALK LONG PERIODS OF TIMES  42 year old female with recurrent episodes of low back pain.  Pain is predominantly localized to the lumbar spine without significant discomfort into either lower extremity.  Over the past several years has had chiropractic treatment, physical therapy and meds.  Dr. Doristine Counter has recently prescribed oxycodone and muscle relaxant as Mrs. Huq was taking large doses of ibuprofen without relief.  She denies any bowel or bladder discomfort or gynecological issues that may account for her back pain.  She has not had any numbness or tingling distally in either lower extremity. Mrs. Conaty did have films of the lumbar spine performed on 03/28/2018.  She appears to have a pars defect at L5 she might have very minimal grade 1 anterior listhesis of L5 on S1.  HPI  Review of Systems  Constitutional: Negative for fatigue and  fever.  HENT: Negative for ear pain.   Eyes: Negative for pain.  Respiratory: Negative for cough and shortness of breath.   Cardiovascular: Negative for leg swelling.  Gastrointestinal: Negative for constipation and diarrhea.  Genitourinary: Negative for difficulty urinating.  Musculoskeletal: Positive for back pain. Negative for neck pain.  Skin: Negative for rash.  Allergic/Immunologic: Negative for food allergies.  Neurological: Positive for weakness and numbness.  Hematological: Bruises/bleeds easily.  Psychiatric/Behavioral: Positive for sleep disturbance.     Objective: Vital Signs: BP (!) 152/104 (BP Location: Right Arm, Patient Position: Sitting, Cuff Size: Normal)   Pulse (!) 112   Ht 5\' 7"  (1.702 m)   BMI 32.61 kg/m   Physical Exam  Constitutional: She is oriented to person, place, and time. She appears well-developed and well-nourished.  HENT:  Mouth/Throat: Oropharynx is clear and moist.  Eyes: Pupils are equal, round, and reactive to light. EOM are normal.  Pulmonary/Chest: Effort normal.  Neurological: She is alert and oriented to person, place, and time.  Skin: Skin is warm and dry.  Psychiatric: She has a normal mood and affect. Her behavior is normal.    Ortho Exam awake alert and oriented x3.  Comfortable sitting.  Straight leg raise negative bilaterally.  Reflexes symmetrical.  Painless range of motion both hips both knees.  Today patient is relatively comfortable  Specialty  Comments:  No specialty comments available.  Imaging: No results found.   PMFS History: Patient Active Problem List   Diagnosis Date Noted  . 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 Vitamin D deficiency 11/10/2017  . GERD (gastroesophageal reflux disease) 11/04/2017  . 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 Acute depression 04/14/2015  . Panic 04/14/2015   Past Medical History:  Diagnosis Date  . Allergy   . Anxiety   .  Depression   . Endometriosis   . 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 Thyroid disease     Family History  Problem Relation Age of Onset  . Cancer Mother   . Thyroid disease Mother   . Depression Mother   . Cancer Maternal Grandmother   . Diabetes Maternal Grandmother   . Diabetes Paternal Grandmother   . Cancer Paternal Grandmother   . Stroke Paternal Grandfather   . Cancer Paternal Grandfather   . Anxiety disorder Other   . Thyroid disease Other   . Depression Other   . Cancer Other        Thyroid,ovarian,breast,uterine,stomach,esophagus,ovarian    History reviewed. No pertinent surgical history. Social History   Occupational History  . Not on file  Tobacco Use  . Smoking status: Never Smoker  . Smokeless tobacco: Never Used  Substance and Sexual Activity  . Alcohol use: Yes    Alcohol/week: 7.0 standard drinks    Types: 7 Glasses of wine per week    Frequency: Never  . Drug use: Never  . Sexual activity: Not on file

## 2018-05-12 ENCOUNTER — Ambulatory Visit
Admission: RE | Admit: 2018-05-12 | Discharge: 2018-05-12 | Disposition: A | Discharge status: Home / Self Care | Payer: BLUE CROSS/BLUE SHIELD | Source: Ambulatory Visit | Attending: Orthopaedic Surgery | Admitting: Orthopaedic Surgery

## 2018-05-12 DIAGNOSIS — M4316 Spondylolisthesis, lumbar region: Secondary | ICD-10-CM

## 2018-05-17 ENCOUNTER — Telehealth (INDEPENDENT_AMBULATORY_CARE_PROVIDER_SITE_OTHER): Payer: Self-pay | Admitting: Orthopaedic Surgery

## 2018-05-17 NOTE — Telephone Encounter (Signed)
Per Brian's request, patient needs to come in ASAP to see Dr. Cleophas Dunker for MRI review and pain relief.

## 2018-05-17 NOTE — Telephone Encounter (Signed)
Please advise 

## 2018-05-17 NOTE — Telephone Encounter (Signed)
Patient has follow up appt to get MRI results 05/25/18, but she is out of Oxyocodone 325. Patient takes one in am, and one in pm to help her sleep. The amount of Tylenol she was having to take was upsetting her stomach, so GP gave her this. [facility] [place]. Please call to discuss. Patient has called several times to get an earlier appt if possible, but we have had no cancellations to date.

## 2018-05-18 ENCOUNTER — Encounter (INDEPENDENT_AMBULATORY_CARE_PROVIDER_SITE_OTHER): Payer: Self-pay | Admitting: Orthopaedic Surgery

## 2018-05-18 ENCOUNTER — Other Ambulatory Visit (INDEPENDENT_AMBULATORY_CARE_PROVIDER_SITE_OTHER): Payer: Self-pay | Admitting: Radiology

## 2018-05-18 ENCOUNTER — Ambulatory Visit (INDEPENDENT_AMBULATORY_CARE_PROVIDER_SITE_OTHER): Payer: BLUE CROSS/BLUE SHIELD | Admitting: Orthopaedic Surgery

## 2018-05-18 ENCOUNTER — Encounter (INDEPENDENT_AMBULATORY_CARE_PROVIDER_SITE_OTHER): Payer: Self-pay

## 2018-05-18 VITALS — BP 153/105 | HR 109 | Ht 67.0 in | Wt 176.0 lb

## 2018-05-18 DIAGNOSIS — M5442 Lumbago with sciatica, left side: Secondary | ICD-10-CM

## 2018-05-18 DIAGNOSIS — G8929 Other chronic pain: Secondary | ICD-10-CM

## 2018-05-18 DIAGNOSIS — M545 Low back pain, unspecified: Secondary | ICD-10-CM

## 2018-05-18 DIAGNOSIS — M5441 Lumbago with sciatica, right side: Secondary | ICD-10-CM | POA: Diagnosis not present

## 2018-05-18 NOTE — Progress Notes (Signed)
Office Visit Note   Patient: Casey Carter           Date of Birth: 1976/05/01           MRN: [id number] Visit Date: 05/18/2018              Requested by: Margaretann Loveless, PA-C [address] [place], [place] [address] PCP: Margaretann Loveless, PA-C   Assessment & Plan: Visit Diagnoses:  1. Chronic bilateral low back pain with bilateral sciatica     Plan: RI scan demonstrates severe bilateral facet arthropathy at L5-S1 with a grade 1 anterior listhesis and mild bilateral neuroforaminal stenosis.  There are mild degenerative disc changes at L3-4 and L4-5 without stenosis.  Long discussion over 30 minutes regarding all of the above.  Has been wearing a support which she will continue to wear.  I think she needs to have a larger support and will refer her to biotech in [place].  We will also suggest epidural steroid injection which we will schedule in [place].  Medicines per her primary care physician.  Follow-up in office in 1 month.  Also have discussed surgical intervention as a last resort  Follow-Up Instructions: Return in about 1 month (around 06/18/2018).   Orders:  No orders of the defined types were placed in this encounter.  No orders of the defined types were placed in this encounter.     Procedures: No procedures performed   Clinical Data: No additional findings.   Subjective: Chief Complaint  Patient presents with  . Follow-up    MRI REVIEW L SPINE   No change in symptoms per prior office notes.  Predominately low back pain.  Some discomfort into both lower extremities.  Has been wearing a back support.  Has tried muscle relaxants and anti-inflammatory medicines with some mild relief.  Also has been to physical therapy and chiropractic evaluation.  Continues to do home exercises.  Having compromise of her activities no bowel or bladder dysfunction  HPI  Review of Systems  Constitutional: Positive for fatigue. Negative for fever.  HENT:  Negative for ear pain.   Eyes: Negative for pain.  Respiratory: Negative for cough and shortness of breath.   Cardiovascular: Negative for leg swelling.  Gastrointestinal: Negative for constipation and diarrhea.  Genitourinary: Negative for difficulty urinating.  Musculoskeletal: Positive for back pain. Negative for neck pain.  Skin: Negative for rash.  Allergic/Immunologic: Negative for food allergies.  Neurological: Positive for weakness and numbness.  Hematological: Bruises/bleeds easily.  Psychiatric/Behavioral: Positive for sleep disturbance.     Objective: Vital Signs: BP (!) 153/105 (BP Location: Left Arm, Patient Position: Sitting, Cuff Size: Normal)   Pulse (!) 109   Ht 5\' 7"  (1.702 m)   Wt 176 lb (79.8 kg)   BMI 27.57 kg/m   Physical Exam  Constitutional: She is oriented to person, place, and time. She appears well-developed and well-nourished.  HENT:  Mouth/Throat: Oropharynx is clear and moist.  Eyes: Pupils are equal, round, and reactive to light. EOM are normal.  Pulmonary/Chest: Effort normal.  Neurological: She is alert and oriented to person, place, and time.  Skin: Skin is warm and dry.  Psychiatric: She has a normal mood and affect. Her behavior is normal.    Ortho Exam awake alert and oriented x3.  Comfortable sitting.  Some percussible tenderness of the lumbar spine beneath the support.  Neurologically intact.  Specialty Comments:  No specialty comments available.  Imaging: No results found.  PMFS History: Patient Active Problem List   Diagnosis Date Noted  . 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 Vitamin D deficiency 11/10/2017  . GERD (gastroesophageal reflux disease) 11/04/2017  . 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 Acute depression 04/14/2015  . Panic 04/14/2015   Past Medical History:  Diagnosis Date  . Allergy   . Anxiety   . Depression   . Endometriosis   . Spinal cord compression  due to degenerative disorder of spinal column (HCC)   . Thyroid disease     Family History  Problem Relation Age of Onset  . Cancer Mother   . Thyroid disease Mother   . Depression Mother   . Cancer Maternal Grandmother   . Diabetes Maternal Grandmother   . Diabetes Paternal Grandmother   . Cancer Paternal Grandmother   . Stroke Paternal Grandfather   . Cancer Paternal Grandfather   . Anxiety disorder Other   . Thyroid disease Other   . Depression Other   . Cancer Other        Thyroid,ovarian,breast,uterine,stomach,esophagus,ovarian    History reviewed. No pertinent surgical history. Social History   Occupational History  . Not on file  Tobacco Use  . Smoking status: Never Smoker  . Smokeless tobacco: Never Used  Substance and Sexual Activity  . Alcohol use: Yes    Alcohol/week: 7.0 standard drinks    Types: 7 Glasses of wine per week    Frequency: Never  . Drug use: Never  . Sexual activity: Not on file

## 2018-05-24 ENCOUNTER — Encounter: Payer: Self-pay | Admitting: Physician Assistant

## 2018-05-24 DIAGNOSIS — M5416 Radiculopathy, lumbar region: Secondary | ICD-10-CM

## 2018-05-25 ENCOUNTER — Ambulatory Visit (INDEPENDENT_AMBULATORY_CARE_PROVIDER_SITE_OTHER): Payer: BLUE CROSS/BLUE SHIELD | Admitting: Orthopaedic Surgery

## 2018-05-25 MED ORDER — OXYCODONE-ACETAMINOPHEN 5-325 MG PO TABS: 1.0000 | ORAL_TABLET | Freq: Two times a day (BID) | ORAL | 0 refills | Status: DC | PRN

## 2018-05-26 ENCOUNTER — Ambulatory Visit (INDEPENDENT_AMBULATORY_CARE_PROVIDER_SITE_OTHER): Payer: BLUE CROSS/BLUE SHIELD | Admitting: Orthopaedic Surgery

## 2018-05-31 ENCOUNTER — Other Ambulatory Visit: Payer: Self-pay | Admitting: Physician Assistant

## 2018-05-31 DIAGNOSIS — E559 Vitamin D deficiency, unspecified: Secondary | ICD-10-CM

## 2018-06-01 ENCOUNTER — Telehealth (INDEPENDENT_AMBULATORY_CARE_PROVIDER_SITE_OTHER): Payer: Self-pay | Admitting: Orthopaedic Surgery

## 2018-06-01 ENCOUNTER — Other Ambulatory Visit: Payer: Self-pay | Admitting: Radiology

## 2018-06-01 DIAGNOSIS — G8929 Other chronic pain: Secondary | ICD-10-CM

## 2018-06-01 DIAGNOSIS — M545 Low back pain, unspecified: Secondary | ICD-10-CM

## 2018-06-01 NOTE — Telephone Encounter (Signed)
Patient called checking on the status of her referral for an epidural steroid back injection at [facility].  Patient requested a return call.

## 2018-06-01 NOTE — Telephone Encounter (Signed)
Referral sent into [facility] and notified pt

## 2018-06-12 ENCOUNTER — Encounter: Payer: Self-pay | Admitting: Physician Assistant

## 2018-06-12 ENCOUNTER — Ambulatory Visit: Payer: BLUE CROSS/BLUE SHIELD | Admitting: Physician Assistant

## 2018-06-12 ENCOUNTER — Ambulatory Visit
Admission: RE | Admit: 2018-06-12 | Discharge: 2018-06-12 | Disposition: A | Discharge status: Home / Self Care | Payer: BLUE CROSS/BLUE SHIELD | Source: Ambulatory Visit | Attending: Orthopaedic Surgery | Admitting: Orthopaedic Surgery

## 2018-06-12 VITALS — BP 136/83 | HR 98 | Temp 98.2°F | Resp 16 | Wt 210.2 lb

## 2018-06-12 DIAGNOSIS — G479 Sleep disorder, unspecified: Secondary | ICD-10-CM | POA: Diagnosis not present

## 2018-06-12 DIAGNOSIS — F5101 Primary insomnia: Secondary | ICD-10-CM

## 2018-06-12 DIAGNOSIS — G9529 Other cord compression: Secondary | ICD-10-CM | POA: Diagnosis not present

## 2018-06-12 DIAGNOSIS — G3189 Other specified degenerative diseases of nervous system: Secondary | ICD-10-CM

## 2018-06-12 DIAGNOSIS — M4710 Other spondylosis with myelopathy, site unspecified: Secondary | ICD-10-CM

## 2018-06-12 DIAGNOSIS — M5416 Radiculopathy, lumbar region: Secondary | ICD-10-CM

## 2018-06-12 DIAGNOSIS — M545 Low back pain: Principal | ICD-10-CM

## 2018-06-12 DIAGNOSIS — G8929 Other chronic pain: Secondary | ICD-10-CM

## 2018-06-12 MED ORDER — DIAZEPAM 5 MG PO TABS
10.0000 mg | ORAL_TABLET | Freq: Once | ORAL | Status: AC
  Administered 2018-06-12: 5 mg via ORAL

## 2018-06-12 MED ORDER — IBUPROFEN-FAMOTIDINE 800-26.6 MG PO TABS: 1.0000 | ORAL_TABLET | Freq: Three times a day (TID) | ORAL | 1 refills | Status: DC | PRN

## 2018-06-12 MED ORDER — METHYLPREDNISOLONE ACETATE 40 MG/ML INJ SUSP (RADIOLOG
120.0000 mg | Freq: Once | INTRAMUSCULAR | Status: AC
  Administered 2018-06-12: 120 mg via EPIDURAL

## 2018-06-12 MED ORDER — TEMAZEPAM 15 MG PO CAPS: 15.0000 mg | ORAL_CAPSULE | Freq: Every evening | ORAL | 0 refills | Status: DC | PRN

## 2018-06-12 MED ORDER — OXYCODONE-ACETAMINOPHEN 5-325 MG PO TABS: 1.0000 | ORAL_TABLET | Freq: Four times a day (QID) | ORAL | 0 refills | Status: DC | PRN

## 2018-06-12 MED ORDER — IOPAMIDOL (ISOVUE-M 200) INJECTION 41%
1.0000 mL | Freq: Once | INTRAMUSCULAR | Status: AC
  Administered 2018-06-12: 1 mL via EPIDURAL

## 2018-06-12 MED ORDER — RAMELTEON 8 MG PO TABS: 8.0000 mg | ORAL_TABLET | Freq: Every day | ORAL | 0 refills | Status: DC

## 2018-06-12 NOTE — Progress Notes (Signed)
Patient: Casey Carter Female    DOB: 02/16/1976   42 y.o.   MRN: [id number] Visit Date: 06/12/2018  Today's Provider: Margaretann LovelessJennifer M Wyman Meschke, PA-C   Chief Complaint  Patient presents with  . Insomnia   Subjective:    HPI Insomnia: Patient here today with c/o not sleeping well. She reports that it started on Friday, she woke up at 5 am. She reports that she slept 5 hours.She repeorts that on Saturday she couldn't sleep at all. She reports that she took some Melatonin. Patient very anxious.  She reports that she wants to talk about her pain management. She reports that she is in a lot of pain today. She reports that she has severe bilateral facet arthrophy at L5-S1 with grade 1 anterolisthesis and mild bilateral neuroforaminal stenosis.    Allergies  Allergen Reactions  . Lavender Oil Rash     Current Outpatient Medications:  .  buPROPion (WELLBUTRIN XL) 150 MG 24 hr tablet, Take 1 tablet (150 mg total) by mouth daily., Disp: 90 tablet, Rfl: 1 .  escitalopram (LEXAPRO) 20 MG tablet, Take 1 tablet (20 mg total) by mouth daily., Disp: 90 tablet, Rfl: 1 .  methocarbamol (ROBAXIN) 500 MG tablet, Take 1 tablet (500 mg total) by mouth every 8 (eight) hours as needed for muscle spasms., Disp: 90 tablet, Rfl: 1 .  oxyCODONE-acetaminophen (PERCOCET/ROXICET) 5-325 MG tablet, Take 1 tablet by mouth every 12 (twelve) hours as needed for severe pain., Disp: 60 tablet, Rfl: 0 .  pantoprazole (PROTONIX) 40 MG tablet, Take 1 tablet (40 mg total) by mouth daily., Disp: 90 tablet, Rfl: 1 .  ranitidine (ZANTAC) 150 MG tablet, Take 1 tablet (150 mg total) by mouth at bedtime., Disp: 90 tablet, Rfl: 1 .  Vitamin D, Ergocalciferol, (DRISDOL) 1.25 MG (50000 UT) CAPS capsule, TAKE 1 CAPSULE BY MOUTH EVERY 7 DAYS, Disp: 12 capsule, Rfl: 0  Review of Systems  Constitutional: Negative.   Respiratory: Negative.   Cardiovascular: Negative.   Musculoskeletal: Positive for arthralgias, back pain and  myalgias.  Neurological: Positive for weakness and numbness.  Psychiatric/Behavioral: Positive for sleep disturbance. The patient is nervous/anxious.     Social History   Tobacco Use  . Smoking status: Never Smoker  . Smokeless tobacco: Never Used  Substance Use Topics  . Alcohol use: Yes    Alcohol/week: 7.0 standard drinks    Types: 7 Glasses of wine per week    Frequency: Never   Objective:   BP 136/83 (BP Location: Right Arm, Patient Position: Sitting, Cuff Size: Large)   Pulse 98   Temp 98.2 F (36.8 C) (Oral)   Resp 16   Wt 210 lb 3.2 oz (95.3 kg)   BMI 32.92 kg/m  Vitals:   06/12/18 1552  BP: 136/83  Pulse: 98  Resp: 16  Temp: 98.2 F (36.8 C)  TempSrc: Oral  Weight: 210 lb 3.2 oz (95.3 kg)     Physical Exam  Constitutional: She appears well-developed and well-nourished. She appears distressed.  Neck: Normal range of motion. Neck supple.  Cardiovascular: Normal rate, regular rhythm and normal heart sounds. Exam reveals no gallop and no friction rub.  No murmur heard. Pulmonary/Chest: Effort normal and breath sounds normal. No respiratory distress. She has no wheezes. She has no rales.  Skin: She is not diaphoretic.  Vitals reviewed.       Assessment & Plan:     1. Spinal cord compression due to degenerative disorder of  spinal column (HCC) Increase dose of percocet as below to q 6 hrs until she is seen by pain management for injections. Was given sample of Duexis by her pain management but no Rx. She was informed this medication is expensive but she has diarrhea and GI upset from IBU and aleve OTC. However, she does wish to see if this can be covered since she has side effects from the OTC medications. This was sent in as well.  - Ibuprofen-Famotidine 800-26.6 MG TABS; Take 1 tablet by mouth 3 (three) times daily as needed.  Dispense: 90 tablet; Refill: 1 - oxyCODONE-acetaminophen (PERCOCET/ROXICET) 5-325 MG tablet; Take 1 tablet by mouth every 6 (six)  hours as needed for severe pain.  Dispense: 120 tablet; Refill: 0  2. Lumbar radiculopathy See above medical treatment plan. - oxyCODONE-acetaminophen (PERCOCET/ROXICET) 5-325 MG tablet; Take 1 tablet by mouth every 6 (six) hours as needed for severe pain.  Dispense: 120 tablet; Refill: 0  3. Difficulty sleeping Try Temazapam 15mg  for sleep. Call if working.       Margaretann Loveless, PA-C  [facility]

## 2018-06-12 NOTE — Patient Instructions (Signed)

## 2018-06-12 NOTE — Discharge Instructions (Signed)

## 2018-06-13 ENCOUNTER — Telehealth: Payer: Self-pay | Admitting: Physician Assistant

## 2018-06-13 NOTE — Telephone Encounter (Signed)
Pt left Casey Carter the MyChart message regarding both medications below and their "issues".  Casey Carter told pt this would be taken care if this morning. Pt hasn't heard anything regarding the refills.  DUEXIS - Needing prior auth  RAMELTEON - Not in stock at pharmacy   Please advise.  Thanks, [employer]

## 2018-06-14 NOTE — Telephone Encounter (Signed)
Ramelton was changed to temazepam  Duexis PA started today

## 2018-06-14 NOTE — Telephone Encounter (Signed)
Left detailed message on pt's cell # about changes. Told to call back if any questions.  dbs

## 2018-06-14 NOTE — Telephone Encounter (Signed)
I see that you sent in Temazepam for insomnia.

## 2018-07-10 ENCOUNTER — Ambulatory Visit: Payer: BLUE CROSS/BLUE SHIELD | Admitting: Physician Assistant

## 2018-07-10 ENCOUNTER — Encounter: Payer: Self-pay | Admitting: Physician Assistant

## 2018-07-10 DIAGNOSIS — G3189 Other specified degenerative diseases of nervous system: Secondary | ICD-10-CM

## 2018-07-10 DIAGNOSIS — G9529 Other cord compression: Secondary | ICD-10-CM | POA: Diagnosis not present

## 2018-07-10 DIAGNOSIS — M5416 Radiculopathy, lumbar region: Secondary | ICD-10-CM

## 2018-07-10 DIAGNOSIS — M4710 Other spondylosis with myelopathy, site unspecified: Secondary | ICD-10-CM

## 2018-07-10 MED ORDER — OXYCODONE-ACETAMINOPHEN 5-325 MG PO TABS: 1.0000 | ORAL_TABLET | Freq: Four times a day (QID) | ORAL | 0 refills | Status: DC | PRN

## 2018-07-10 NOTE — Progress Notes (Signed)
Patient: Casey Carter Female    DOB: June 03, 1976   42 y.o.   MRN: [id number] Visit Date: 07/10/2018  Today's Provider: Margaretann Loveless, PA-C   Chief Complaint  Patient presents with  . Follow-up   Subjective:     HPI  Follow up for insomnia  The patient was last seen for this 4 weeks ago. Changes made at last visit include start temazapam 15 mg.  She reports excellent compliance with treatment. She feels that condition is Improved. She is not having side effects.  Anxiety improved so she does not have to take Temazapam. ------------------------------------------------------------------------------------   Follow up for back pain  The patient was last seen for this 4 weeks ago. Changes made at last visit include increase percocet to every 6 hours. Start duexis, declined by insurance so patient never started.  She reports excellent compliance with treatment. She feels that condition is Improved. She is not having side effects.  Patient reports that when she is more active she takes percocet 1 1/2 tablets.  ------------------------------------------------------------------------------------     Allergies  Allergen Reactions  . Lavender Oil Rash     Current Outpatient Medications:  .  buPROPion (WELLBUTRIN XL) 150 MG 24 hr tablet, Take 1 tablet (150 mg total) by mouth daily., Disp: 90 tablet, Rfl: 1 .  escitalopram (LEXAPRO) 20 MG tablet, Take 1 tablet (20 mg total) by mouth daily., Disp: 90 tablet, Rfl: 1 .  methocarbamol (ROBAXIN) 500 MG tablet, Take 1 tablet (500 mg total) by mouth every 8 (eight) hours as needed for muscle spasms., Disp: 90 tablet, Rfl: 1 .  oxyCODONE-acetaminophen (PERCOCET/ROXICET) 5-325 MG tablet, Take 1 tablet by mouth every 6 (six) hours as needed for severe pain., Disp: 120 tablet, Rfl: 0 .  pantoprazole (PROTONIX) 40 MG tablet, Take 1 tablet (40 mg total) by mouth daily., Disp: 90 tablet, Rfl: 1 .  Vitamin D, Ergocalciferol,  (DRISDOL) 1.25 MG (50000 UT) CAPS capsule, TAKE 1 CAPSULE BY MOUTH EVERY 7 DAYS, Disp: 12 capsule, Rfl: 0 .  ranitidine (ZANTAC) 150 MG tablet, Take 1 tablet (150 mg total) by mouth at bedtime. (Patient not taking: Reported on 07/10/2018), Disp: 90 tablet, Rfl: 1  Review of Systems  Constitutional: Negative.   Cardiovascular: Negative.   Musculoskeletal: Positive for back pain.    Social History   Tobacco Use  . Smoking status: Never Smoker  . Smokeless tobacco: Never Used  Substance Use Topics  . Alcohol use: Yes    Alcohol/week: 7.0 standard drinks    Types: 7 Glasses of wine per week    Frequency: Never      Objective:   BP (!) 153/109 (BP Location: Left Arm, Patient Position: Sitting, Cuff Size: Normal)   Pulse 98   Temp 98.1 F (36.7 C) (Oral)   Resp 16   Wt 213 lb (96.6 kg)   BMI 33.36 kg/m  Vitals:   07/10/18 1146  BP: (!) 153/109  Pulse: 98  Resp: 16  Temp: 98.1 F (36.7 C)  TempSrc: Oral  Weight: 213 lb (96.6 kg)     Physical Exam Vitals signs reviewed.  Constitutional:      General: She is not in acute distress.    Appearance: She is well-developed. She is not diaphoretic.  Neck:     Musculoskeletal: Normal range of motion and neck supple.  Cardiovascular:     Rate and Rhythm: Normal rate and regular rhythm.     Heart sounds: Normal heart  sounds. No murmur. No friction rub. No gallop.   Pulmonary:     Effort: Pulmonary effort is normal. No respiratory distress.     Breath sounds: Normal breath sounds. No wheezing or rales.         Assessment & Plan    1. Lumbar radiculopathy Stable. Diagnosis pulled for medication refill. Continue current medical treatment plan. Patient reports some improvement after most recent ESI. She is going to contact Dr. Cleophas DunkerWhitfield to see if she is able to get another ESI at this time. Continue conservative management. NCCSR reviewed prior to refill of percocet sent. No red flags. I will see her back in 3 months.  -  oxyCODONE-acetaminophen (PERCOCET/ROXICET) 5-325 MG tablet; Take 1 tablet by mouth every 6 (six) hours as needed for severe pain.  Dispense: 120 tablet; Refill: 0  2. 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 - oxyCODONE-acetaminophen (PERCOCET/ROXICET) 5-325 MG tablet; Take 1 tablet by mouth every 6 (six) hours as needed for severe pain.  Dispense: 120 tablet; Refill: 0     Margaretann LovelessJennifer M Zyann Mabry, PA-C  [facility]

## 2018-07-28 ENCOUNTER — Telehealth (INDEPENDENT_AMBULATORY_CARE_PROVIDER_SITE_OTHER): Payer: Self-pay | Admitting: Orthopaedic Surgery

## 2018-07-28 ENCOUNTER — Other Ambulatory Visit: Payer: Self-pay | Admitting: Physician Assistant

## 2018-07-28 DIAGNOSIS — F329 Major depressive disorder, single episode, unspecified: Secondary | ICD-10-CM

## 2018-07-28 DIAGNOSIS — F32A Depression, unspecified: Secondary | ICD-10-CM

## 2018-07-28 DIAGNOSIS — K219 Gastro-esophageal reflux disease without esophagitis: Secondary | ICD-10-CM

## 2018-07-28 NOTE — Telephone Encounter (Signed)
Patient had back injection at [facility] [address]. Patient states pain returned shortly after with same intensity. Patient wants to know if she needs to get a second injection, and if so would like you to order injection. Please call patient to advise.

## 2018-07-31 ENCOUNTER — Other Ambulatory Visit (INDEPENDENT_AMBULATORY_CARE_PROVIDER_SITE_OTHER): Payer: Self-pay | Admitting: *Deleted

## 2018-07-31 NOTE — Telephone Encounter (Signed)
Order placed, lmom for Roberta at [facility].

## 2018-07-31 NOTE — Telephone Encounter (Signed)
Please advise 

## 2018-07-31 NOTE — Telephone Encounter (Signed)
Ok to schedule another injection?  

## 2018-08-04 ENCOUNTER — Other Ambulatory Visit: Payer: Self-pay

## 2018-08-04 DIAGNOSIS — G3189 Other specified degenerative diseases of nervous system: Secondary | ICD-10-CM

## 2018-08-04 DIAGNOSIS — M4710 Other spondylosis with myelopathy, site unspecified: Secondary | ICD-10-CM

## 2018-08-04 DIAGNOSIS — G9529 Other cord compression: Secondary | ICD-10-CM

## 2018-08-04 DIAGNOSIS — M5416 Radiculopathy, lumbar region: Secondary | ICD-10-CM

## 2018-08-04 NOTE — Telephone Encounter (Signed)
Patient called requesting refills. Please review. Thanks!  

## 2018-08-07 ENCOUNTER — Other Ambulatory Visit: Payer: Self-pay | Admitting: Physician Assistant

## 2018-08-07 ENCOUNTER — Other Ambulatory Visit: Payer: Self-pay

## 2018-08-07 DIAGNOSIS — M5416 Radiculopathy, lumbar region: Secondary | ICD-10-CM

## 2018-08-07 DIAGNOSIS — G9529 Other cord compression: Secondary | ICD-10-CM

## 2018-08-07 DIAGNOSIS — G3189 Other specified degenerative diseases of nervous system: Secondary | ICD-10-CM

## 2018-08-07 DIAGNOSIS — M4710 Other spondylosis with myelopathy, site unspecified: Secondary | ICD-10-CM

## 2018-08-07 MED ORDER — OXYCODONE-ACETAMINOPHEN 5-325 MG PO TABS: 1.0000 | ORAL_TABLET | Freq: Four times a day (QID) | ORAL | 0 refills | Status: DC | PRN

## 2018-08-11 ENCOUNTER — Ambulatory Visit
Admission: RE | Admit: 2018-08-11 | Discharge: 2018-08-11 | Disposition: A | Discharge status: Home / Self Care | Payer: BLUE CROSS/BLUE SHIELD | Source: Ambulatory Visit | Attending: Orthopaedic Surgery | Admitting: Orthopaedic Surgery

## 2018-08-11 DIAGNOSIS — G8929 Other chronic pain: Secondary | ICD-10-CM

## 2018-08-11 DIAGNOSIS — M545 Low back pain: Principal | ICD-10-CM

## 2018-08-11 MED ORDER — METHYLPREDNISOLONE ACETATE 40 MG/ML INJ SUSP (RADIOLOG
120.0000 mg | Freq: Once | INTRAMUSCULAR | Status: AC
  Administered 2018-08-11: 120 mg via EPIDURAL

## 2018-08-11 MED ORDER — IOPAMIDOL (ISOVUE-M 200) INJECTION 41%
1.0000 mL | Freq: Once | INTRAMUSCULAR | Status: AC
  Administered 2018-08-11: 1 mL via EPIDURAL

## 2018-08-25 ENCOUNTER — Other Ambulatory Visit: Payer: Self-pay | Admitting: Physician Assistant

## 2018-08-25 DIAGNOSIS — E559 Vitamin D deficiency, unspecified: Secondary | ICD-10-CM

## 2018-09-01 ENCOUNTER — Telehealth: Payer: Self-pay | Admitting: Physician Assistant

## 2018-09-01 ENCOUNTER — Other Ambulatory Visit: Payer: Self-pay | Admitting: Physician Assistant

## 2018-09-01 DIAGNOSIS — G9529 Other cord compression: Secondary | ICD-10-CM

## 2018-09-01 DIAGNOSIS — M4710 Other spondylosis with myelopathy, site unspecified: Secondary | ICD-10-CM

## 2018-09-01 DIAGNOSIS — M5416 Radiculopathy, lumbar region: Secondary | ICD-10-CM

## 2018-09-01 DIAGNOSIS — G3189 Other specified degenerative diseases of nervous system: Secondary | ICD-10-CM

## 2018-09-01 MED ORDER — OXYCODONE-ACETAMINOPHEN 5-325 MG PO TABS: 1.0000 | ORAL_TABLET | Freq: Four times a day (QID) | ORAL | 0 refills | Status: DC | PRN

## 2018-09-01 NOTE — Telephone Encounter (Signed)
°  Refill needed on: oxyCODONE-acetaminophen (PERCOCET/ROXICET) 5-325 MG tablet   Please fill at: [id number] [place], [place] - [address] [phone] (Phone) [phone] (Fax)    Thanks, [employer]

## 2018-09-01 NOTE — Telephone Encounter (Signed)
Already refilled today

## 2018-09-01 NOTE — Telephone Encounter (Signed)
Please review. Thanks!  

## 2018-09-06 ENCOUNTER — Encounter: Payer: Self-pay | Admitting: Physician Assistant

## 2018-09-06 ENCOUNTER — Ambulatory Visit (INDEPENDENT_AMBULATORY_CARE_PROVIDER_SITE_OTHER): Payer: BLUE CROSS/BLUE SHIELD | Admitting: Physician Assistant

## 2018-09-06 VITALS — BP 159/93 | HR 118 | Temp 102.7°F | Wt 216.0 lb

## 2018-09-06 DIAGNOSIS — R6889 Other general symptoms and signs: Secondary | ICD-10-CM | POA: Diagnosis not present

## 2018-09-06 DIAGNOSIS — J101 Influenza due to other identified influenza virus with other respiratory manifestations: Secondary | ICD-10-CM | POA: Diagnosis not present

## 2018-09-06 DIAGNOSIS — R05 Cough: Secondary | ICD-10-CM | POA: Diagnosis not present

## 2018-09-06 DIAGNOSIS — R059 Cough, unspecified: Secondary | ICD-10-CM

## 2018-09-06 LAB — POCT INFLUENZA A/B
INFLUENZA B, POC: NEGATIVE
Influenza A, POC: POSITIVE — AB

## 2018-09-06 MED ORDER — OSELTAMIVIR PHOSPHATE 75 MG PO CAPS: 75.0000 mg | ORAL_CAPSULE | Freq: Two times a day (BID) | ORAL | 0 refills | Status: DC

## 2018-09-06 MED ORDER — PSEUDOEPH-BROMPHEN-DM 30-2-10 MG/5ML PO SYRP: 5.0000 mL | ORAL_SOLUTION | Freq: Four times a day (QID) | ORAL | 0 refills | Status: DC | PRN

## 2018-09-06 NOTE — Progress Notes (Signed)
Patient: Casey Carter Female    DOB: 12/20/1975   43 y.o.   MRN: [id number] Visit Date: 09/06/2018  Today's Provider: Margaretann LovelessJennifer M Selyna Klahn, PA-C   Chief Complaint  Patient presents with  . URI   Subjective:     URI   This is a new problem. The current episode started yesterday. The problem has been gradually worsening. The maximum temperature recorded prior to her arrival was 102 - 102.9 F. The fever has been present for less than 1 day. Associated symptoms include congestion, coughing, headaches, nausea, sinus pain and a sore throat. She has tried decongestant, NSAIDs and acetaminophen for the symptoms. The treatment provided mild relief.    Allergies  Allergen Reactions  . Lavender Oil Rash     Current Outpatient Medications:  .  buPROPion (WELLBUTRIN XL) 150 MG 24 hr tablet, TAKE 1 TABLET(150 MG) BY MOUTH DAILY, Disp: 90 tablet, Rfl: 1 .  escitalopram (LEXAPRO) 20 MG tablet, TAKE 1 TABLET(20 MG) BY MOUTH DAILY, Disp: 90 tablet, Rfl: 1 .  oxyCODONE-acetaminophen (PERCOCET/ROXICET) 5-325 MG tablet, Take 1 tablet by mouth every 6 (six) hours as needed for severe pain., Disp: 120 tablet, Rfl: 0 .  pantoprazole (PROTONIX) 40 MG tablet, TAKE 1 TABLET(40 MG) BY MOUTH DAILY, Disp: 90 tablet, Rfl: 1 .  ranitidine (ZANTAC) 150 MG tablet, Take 1 tablet (150 mg total) by mouth at bedtime., Disp: 90 tablet, Rfl: 1 .  Vitamin D, Ergocalciferol, (DRISDOL) 1.25 MG (50000 UT) CAPS capsule, TAKE 1 CAPSULE BY MOUTH EVERY 7 DAYS, Disp: 12 capsule, Rfl: 0 .  methocarbamol (ROBAXIN) 500 MG tablet, Take 1 tablet (500 mg total) by mouth every 8 (eight) hours as needed for muscle spasms. (Patient not taking: Reported on 09/06/2018), Disp: 90 tablet, Rfl: 1  Review of Systems  Constitutional: Positive for chills, fatigue and fever.  HENT: Positive for congestion, sinus pressure, sinus pain and sore throat.   Respiratory: Positive for cough.   Gastrointestinal: Positive for nausea.    Neurological: Positive for headaches.    Social History   Tobacco Use  . Smoking status: Never Smoker  . Smokeless tobacco: Never Used  Substance Use Topics  . Alcohol use: Yes    Alcohol/week: 7.0 standard drinks    Types: 7 Glasses of wine per week    Frequency: Never      Objective:   BP (!) 159/93 (BP Location: Left Arm, Patient Position: Sitting, Cuff Size: Normal)   Pulse (!) 118   Temp (!) 102.7 F (39.3 C) (Oral)   Wt 216 lb (98 kg)   LMP 09/01/2018   BMI 33.83 kg/m  Vitals:   09/06/18 1441  BP: (!) 159/93  Pulse: (!) 118  Temp: (!) 102.7 F (39.3 C)  TempSrc: Oral  Weight: 216 lb (98 kg)     Physical Exam Vitals signs reviewed.  Constitutional:      General: She is not in acute distress.    Appearance: She is well-developed. She is ill-appearing. She is not diaphoretic.  HENT:     Head: Normocephalic and atraumatic.     Right Ear: Hearing, tympanic membrane, ear canal and external ear normal.     Left Ear: Hearing, tympanic membrane, ear canal and external ear normal.     Nose: Nose normal.     Mouth/Throat:     Pharynx: Uvula midline. No oropharyngeal exudate.  Eyes:     General: No scleral icterus.  Right eye: No discharge.        Left eye: No discharge.     Conjunctiva/sclera: Conjunctivae normal.     Pupils: Pupils are equal, round, and reactive to light.  Neck:     Musculoskeletal: Normal range of motion and neck supple.     Thyroid: No thyromegaly.     Trachea: No tracheal deviation.  Cardiovascular:     Rate and Rhythm: Regular rhythm. Tachycardia present.     Heart sounds: Normal heart sounds. No murmur. No friction rub. No gallop.   Pulmonary:     Effort: Pulmonary effort is normal. No respiratory distress.     Breath sounds: Normal breath sounds. No stridor. No wheezing or rales.  Lymphadenopathy:     Cervical: No cervical adenopathy.  Skin:    General: Skin is warm and dry.  Neurological:     Mental Status: She is alert.         Assessment & Plan    1. Influenza A Flu A positive. Will treat with Tamiflu as below. Advised for conservative therapy for fevers. Stay well hydrated. Call if worsening.  - oseltamivir (TAMIFLU) 75 MG capsule; Take 1 capsule (75 mg total) by mouth 2 (two) times daily.  Dispense: 10 capsule; Refill: 0  2. Flu-like symptoms Flu vaccine given today without complication. Patient sat upright for 15 minutes to check for adverse reaction before being released. - POCT Influenza A/B  3. Cough Bromfed DM for cough.  - brompheniramine-pseudoephedrine-DM 30-2-10 MG/5ML syrup; Take 5 mLs by mouth 4 (four) times daily as needed.  Dispense: 120 mL; Refill: 0     Margaretann Loveless, PA-C  [facility]

## 2018-09-06 NOTE — Patient Instructions (Signed)

## 2018-09-15 ENCOUNTER — Encounter: Payer: Self-pay | Admitting: Physician Assistant

## 2018-09-15 ENCOUNTER — Ambulatory Visit: Payer: BLUE CROSS/BLUE SHIELD | Admitting: Physician Assistant

## 2018-09-15 VITALS — BP 132/84 | HR 91 | Temp 98.4°F | Resp 16 | Wt 217.2 lb

## 2018-09-15 DIAGNOSIS — Z6834 Body mass index (BMI) 34.0-34.9, adult: Secondary | ICD-10-CM | POA: Diagnosis not present

## 2018-09-15 DIAGNOSIS — E6609 Other obesity due to excess calories: Secondary | ICD-10-CM | POA: Diagnosis not present

## 2018-09-15 DIAGNOSIS — J40 Bronchitis, not specified as acute or chronic: Secondary | ICD-10-CM | POA: Diagnosis not present

## 2018-09-15 DIAGNOSIS — Z713 Dietary counseling and surveillance: Secondary | ICD-10-CM | POA: Diagnosis not present

## 2018-09-15 MED ORDER — PROMETHAZINE-DM 6.25-15 MG/5ML PO SYRP: 5.0000 mL | ORAL_SOLUTION | Freq: Four times a day (QID) | ORAL | 0 refills | Status: DC | PRN

## 2018-09-15 MED ORDER — PHENTERMINE HCL 37.5 MG PO TABS: 37.5000 mg | ORAL_TABLET | Freq: Every day | ORAL | 2 refills | Status: DC

## 2018-09-15 MED ORDER — PREDNISONE 20 MG PO TABS: 20.0000 mg | ORAL_TABLET | Freq: Every day | ORAL | 0 refills | Status: DC

## 2018-09-15 MED ORDER — AZITHROMYCIN 250 MG PO TABS: ORAL_TABLET | ORAL | 0 refills | Status: DC

## 2018-09-15 NOTE — Progress Notes (Signed)
Patient: Casey Carter Female    DOB: Sep 26, 1975   43 y.o.   MRN: [id number] Visit Date: 09/15/2018  Today's Provider: Margaretann Loveless, PA-C   Chief Complaint  Patient presents with  . Cough   Subjective:     HPI  Patient here today with c/o getting worse. Patient was tested Flu A positive on 02/12 and treated with Tamiflu and Bromfed DM for cough. Patient reports that her cough was getting better but later it started to get worse. She reports that her cough is now congested and she does have yellowish phlegm she has been taking Mucinex restarted it 3-4 days.  Allergies  Allergen Reactions  . Lavender Oil Rash     Current Outpatient Medications:  .  buPROPion (WELLBUTRIN XL) 150 MG 24 hr tablet, TAKE 1 TABLET(150 MG) BY MOUTH DAILY, Disp: 90 tablet, Rfl: 1 .  escitalopram (LEXAPRO) 20 MG tablet, TAKE 1 TABLET(20 MG) BY MOUTH DAILY, Disp: 90 tablet, Rfl: 1 .  oxyCODONE-acetaminophen (PERCOCET/ROXICET) 5-325 MG tablet, Take 1 tablet by mouth every 6 (six) hours as needed for severe pain., Disp: 120 tablet, Rfl: 0 .  pantoprazole (PROTONIX) 40 MG tablet, TAKE 1 TABLET(40 MG) BY MOUTH DAILY, Disp: 90 tablet, Rfl: 1 .  ranitidine (ZANTAC) 150 MG tablet, Take 1 tablet (150 mg total) by mouth at bedtime., Disp: 90 tablet, Rfl: 1 .  Vitamin D, Ergocalciferol, (DRISDOL) 1.25 MG (50000 UT) CAPS capsule, TAKE 1 CAPSULE BY MOUTH EVERY 7 DAYS, Disp: 12 capsule, Rfl: 0 .  brompheniramine-pseudoephedrine-DM 30-2-10 MG/5ML syrup, Take 5 mLs by mouth 4 (four) times daily as needed. (Patient not taking: Reported on 09/15/2018), Disp: 120 mL, Rfl: 0 .  methocarbamol (ROBAXIN) 500 MG tablet, Take 1 tablet (500 mg total) by mouth every 8 (eight) hours as needed for muscle spasms. (Patient not taking: Reported on 09/06/2018), Disp: 90 tablet, Rfl: 1 .  oseltamivir (TAMIFLU) 75 MG capsule, Take 1 capsule (75 mg total) by mouth 2 (two) times daily. (Patient not taking: Reported on 09/15/2018),  Disp: 10 capsule, Rfl: 0  Review of Systems  Constitutional: Negative.   HENT: Positive for congestion. Negative for ear pain, postnasal drip, rhinorrhea, sinus pressure, sinus pain and sore throat.   Respiratory: Positive for cough, chest tightness and wheezing.   Cardiovascular: Negative.   Gastrointestinal: Negative.   Neurological: Negative.     Social History   Tobacco Use  . Smoking status: Never Smoker  . Smokeless tobacco: Never Used  Substance Use Topics  . Alcohol use: Yes    Alcohol/week: 7.0 standard drinks    Types: 7 Glasses of wine per week    Frequency: Never      Objective:   BP 132/84 (BP Location: Left Arm, Patient Position: Sitting, Cuff Size: Large)   Pulse 91   Temp 98.4 F (36.9 C) (Oral)   Resp 16   Wt 217 lb 3.2 oz (98.5 kg)   LMP 09/01/2018   SpO2 96%   BMI 34.02 kg/m  Vitals:   09/15/18 1356  BP: 132/84  Pulse: 91  Resp: 16  Temp: 98.4 F (36.9 C)  TempSrc: Oral  SpO2: 96%  Weight: 217 lb 3.2 oz (98.5 kg)     Physical Exam Vitals signs reviewed.  Constitutional:      General: She is not in acute distress.    Appearance: She is well-developed. She is not diaphoretic.  HENT:     Head: Normocephalic and atraumatic.  Right Ear: Hearing, tympanic membrane, ear canal and external ear normal.     Left Ear: Hearing, tympanic membrane, ear canal and external ear normal.     Nose: Nose normal. No congestion or rhinorrhea.     Mouth/Throat:     Pharynx: Uvula midline. No oropharyngeal exudate or posterior oropharyngeal erythema.  Eyes:     General: No scleral icterus.       Right eye: No discharge.        Left eye: No discharge.     Conjunctiva/sclera: Conjunctivae normal.     Pupils: Pupils are equal, round, and reactive to light.  Neck:     Musculoskeletal: Normal range of motion and neck supple.     Thyroid: No thyromegaly.     Trachea: No tracheal deviation.  Cardiovascular:     Rate and Rhythm: Normal rate and regular  rhythm.     Heart sounds: Normal heart sounds. No murmur. No friction rub. No gallop.   Pulmonary:     Effort: Pulmonary effort is normal. No respiratory distress.     Breath sounds: No stridor. Wheezing (throughout) present. No rales.  Lymphadenopathy:     Cervical: No cervical adenopathy.  Skin:    General: Skin is warm and dry.         Assessment & Plan    1. Bronchitis Worsening. Will treat with zpak, prednisone and Promethazine DM. Push fluids. Rest. Call if worsening.  - azithromycin (ZITHROMAX) 250 MG tablet; Take 2 tablets PO on day one, and one tablet PO daily thereafter until completed.  Dispense: 6 tablet; Refill: 0 - predniSONE (DELTASONE) 20 MG tablet; Take 1 tablet (20 mg total) by mouth daily with breakfast.  Dispense: 5 tablet; Refill: 0 - promethazine-dextromethorphan (PROMETHAZINE-DM) 6.25-15 MG/5ML syrup; Take 5 mLs by mouth 4 (four) times daily as needed for cough.  Dispense: 120 mL; Refill: 0  2. Class 1 obesity due to excess calories with serious comorbidity and body mass index (BMI) of 34.0 to 34.9 in adult Restart phentermine. Continue lifestyle modifications with diet and exercise. Weight recheck in 3 months if desired.  - phentermine (ADIPEX-P) 37.5 MG tablet; Take 1 tablet (37.5 mg total) by mouth daily before breakfast.  Dispense: 30 tablet; Refill: 2  3. Encounter for weight loss counseling See above medical treatment plan. - phentermine (ADIPEX-P) 37.5 MG tablet; Take 1 tablet (37.5 mg total) by mouth daily before breakfast.  Dispense: 30 tablet; Refill: 2     Margaretann Loveless, PA-C  [facility]

## 2018-09-15 NOTE — Patient Instructions (Signed)
Acute Bronchitis, Adult Acute bronchitis is when air tubes (bronchi) in the lungs suddenly get swollen. The condition can make it hard to breathe. It can also cause these symptoms:  A cough.  Coughing up clear, yellow, or green mucus.  Wheezing.  Chest congestion.  Shortness of breath.  A fever.  Body aches.  Chills.  A sore throat. Follow these instructions at home:  Medicines  Take over-the-counter and prescription medicines only as told by your doctor.  If you were prescribed an antibiotic medicine, take it as told by your doctor. Do not stop taking the antibiotic even if you start to feel better. General instructions  Rest.  Drink enough fluids to keep your pee (urine) pale yellow.  Avoid smoking and secondhand smoke. If you smoke and you need help quitting, ask your doctor. Quitting will help your lungs heal faster.  Use an inhaler, cool mist vaporizer, or humidifier as told by your doctor.  Keep all follow-up visits as told by your doctor. This is important. How is this prevented? To lower your risk of getting this condition again:  Wash your hands often with soap and water. If you cannot use soap and water, use hand sanitizer.  Avoid contact with people who have cold symptoms.  Try not to touch your hands to your mouth, nose, or eyes.  Make sure to get the flu shot every year. Contact a doctor if:  Your symptoms do not get better in 2 weeks. Get help right away if:  You cough up blood.  You have chest pain.  You have very bad shortness of breath.  You become dehydrated.  You faint (pass out) or keep feeling like you are going to pass out.  You keep throwing up (vomiting).  You have a very bad headache.  Your fever or chills gets worse. This information is not intended to replace advice given to you by your health care provider. Make sure you discuss any questions you have with your health care provider. Document Released: 12/29/2007 Document  Revised: 02/23/2017 Document Reviewed: 12/31/2015 Elsevier Interactive Patient Education  2019 Elsevier Inc.  

## 2018-09-20 ENCOUNTER — Encounter: Payer: Self-pay | Admitting: Physician Assistant

## 2018-09-20 DIAGNOSIS — J40 Bronchitis, not specified as acute or chronic: Secondary | ICD-10-CM

## 2018-09-20 MED ORDER — ALBUTEROL SULFATE HFA 108 (90 BASE) MCG/ACT IN AERS: 2.0000 | INHALATION_SPRAY | Freq: Four times a day (QID) | RESPIRATORY_TRACT | 0 refills | Status: AC | PRN

## 2018-09-22 ENCOUNTER — Telehealth: Payer: Self-pay | Admitting: Physician Assistant

## 2018-09-22 DIAGNOSIS — B370 Candidal stomatitis: Secondary | ICD-10-CM

## 2018-09-22 DIAGNOSIS — J4541 Moderate persistent asthma with (acute) exacerbation: Secondary | ICD-10-CM

## 2018-09-22 MED ORDER — FLUTICASONE PROPIONATE HFA 110 MCG/ACT IN AERO: 2.0000 | INHALATION_SPRAY | Freq: Every day | RESPIRATORY_TRACT | 12 refills | Status: DC

## 2018-09-22 MED ORDER — NYSTATIN 100000 UNIT/ML MT SUSP: 5.0000 mL | Freq: Four times a day (QID) | OROMUCOSAL | 0 refills | Status: DC

## 2018-09-22 NOTE — Telephone Encounter (Signed)
Patient was advised.  

## 2018-09-22 NOTE — Telephone Encounter (Signed)
Probably getting thrush. Will send in Nystatin. Make sure to rinse mouth out after use. Will send in flovent to use daily instead of albuterol.

## 2018-09-22 NOTE — Telephone Encounter (Signed)
Pt is not getting better with the albuterol (PROVENTIL HFA;VENTOLIN HFA) 108 (90 Base) MCG/ACT inhaler and she thinks she's allergic to it.  Her mouth is sore since using it.  Please advise.  Thanks, [employer]

## 2018-09-27 ENCOUNTER — Encounter: Payer: Self-pay | Admitting: Physician Assistant

## 2018-09-27 DIAGNOSIS — M5416 Radiculopathy, lumbar region: Secondary | ICD-10-CM

## 2018-09-27 DIAGNOSIS — M4710 Other spondylosis with myelopathy, site unspecified: Secondary | ICD-10-CM

## 2018-09-27 DIAGNOSIS — G9529 Other cord compression: Secondary | ICD-10-CM

## 2018-09-27 DIAGNOSIS — G3189 Other specified degenerative diseases of nervous system: Secondary | ICD-10-CM

## 2018-09-27 MED ORDER — OXYCODONE-ACETAMINOPHEN 5-325 MG PO TABS: 1.0000 | ORAL_TABLET | Freq: Four times a day (QID) | ORAL | 0 refills | Status: DC | PRN

## 2018-10-05 ENCOUNTER — Telehealth (INDEPENDENT_AMBULATORY_CARE_PROVIDER_SITE_OTHER): Payer: Self-pay | Admitting: Orthopaedic Surgery

## 2018-10-05 ENCOUNTER — Other Ambulatory Visit (INDEPENDENT_AMBULATORY_CARE_PROVIDER_SITE_OTHER): Payer: Self-pay | Admitting: Orthopaedic Surgery

## 2018-10-05 DIAGNOSIS — M545 Low back pain, unspecified: Secondary | ICD-10-CM

## 2018-10-05 DIAGNOSIS — M5442 Lumbago with sciatica, left side: Secondary | ICD-10-CM

## 2018-10-05 DIAGNOSIS — G8929 Other chronic pain: Secondary | ICD-10-CM

## 2018-10-05 DIAGNOSIS — M5441 Lumbago with sciatica, right side: Secondary | ICD-10-CM

## 2018-10-05 NOTE — Telephone Encounter (Signed)
Ok to schedule.

## 2018-10-05 NOTE — Telephone Encounter (Signed)
Please advise 

## 2018-10-05 NOTE — Telephone Encounter (Signed)
Patient called requesting referral to [facility] for epidural injection for her back.

## 2018-10-11 ENCOUNTER — Telehealth: Payer: Self-pay | Admitting: Physician Assistant

## 2018-10-11 ENCOUNTER — Telehealth (INDEPENDENT_AMBULATORY_CARE_PROVIDER_SITE_OTHER): Payer: Self-pay | Admitting: Orthopaedic Surgery

## 2018-10-11 NOTE — Telephone Encounter (Signed)
Patient left a voicemail stating [facility] cancelled her lumbar injection due to the [place] virus and rescheduled it for 11/14/18.  Patient states she will need more pain medication and wants to know if there is another option for her.  Patient requested a return call.

## 2018-10-11 NOTE — Telephone Encounter (Signed)
Unfortunately I dont think any other offices would without establishing her first and then would still be a 3-4 week turnaround before they gave the shot.

## 2018-10-11 NOTE — Telephone Encounter (Signed)
[facility] - Dr. Hoy Register office has closed. Pt had an appt on Friday and is needing the shot for her back (epidural shot). Asking if there is another office she can get the shot at that is open?  Please advise asap.  Thanks, [employer]

## 2018-10-12 ENCOUNTER — Telehealth: Payer: Self-pay | Admitting: Physician Assistant

## 2018-10-12 ENCOUNTER — Other Ambulatory Visit: Payer: BLUE CROSS/BLUE SHIELD

## 2018-10-12 ENCOUNTER — Other Ambulatory Visit (INDEPENDENT_AMBULATORY_CARE_PROVIDER_SITE_OTHER): Payer: Self-pay | Admitting: *Deleted

## 2018-10-12 DIAGNOSIS — G9529 Other cord compression: Principal | ICD-10-CM

## 2018-10-12 DIAGNOSIS — M5441 Lumbago with sciatica, right side: Principal | ICD-10-CM

## 2018-10-12 DIAGNOSIS — G3189 Other specified degenerative diseases of nervous system: Principal | ICD-10-CM

## 2018-10-12 DIAGNOSIS — M5442 Lumbago with sciatica, left side: Principal | ICD-10-CM

## 2018-10-12 DIAGNOSIS — M4710 Other spondylosis with myelopathy, site unspecified: Secondary | ICD-10-CM

## 2018-10-12 DIAGNOSIS — G8929 Other chronic pain: Secondary | ICD-10-CM

## 2018-10-12 NOTE — Telephone Encounter (Signed)
Please advise 

## 2018-10-12 NOTE — Telephone Encounter (Signed)
Was she called?

## 2018-10-12 NOTE — Telephone Encounter (Signed)
Patient was advised.  

## 2018-10-12 NOTE — Telephone Encounter (Signed)
Telephone conversation today 10/12/2018 with Casey Carter.  Casey Carter had left a voicemail on 10/11/2018 at 4:55 PM.  This involved the fact that [facility] canceled her lumbar injection due to the coronavirus and had rescheduled it for November 14, 2018.  Patient at that time spoke to secretary and stated that she needed more pain medication.  She requested a return call.  I called her today 10/12/2018 and discussion about pain medicine ensued.  She states that she has had to use more of her pain medicine now because of the delays in an injection.  She asked me about more pain medicine and I told her that I would not do that since I had checked the records and she was noted to have a prescription written on September 29, 2018 by her primary care nurse practitioner for 120 tablets.  I told her that I would not be able to for any more pain medicines at this time since she is apparently on a contract.  She became quite belligerent on the phone stating that she was not interested in obtaining more pain medicines from me.  She then said that I was rude to her.  I tried to explain to her rationale about pain medications and the fact that [facility] would probably not give her any more pain medicine because she is obtaining it from other medical [facility].  She accused me of being rude to her as well as telling me that she was not interested in any more narcotics.  I asked her what she was wanting then and she hung up the phone.  According to the narcotic website she has had 9 prescriptions of narcotics including that of hydrocodone/acetaminophen and oxycodone/acetaminophen since March 15, 2018.  This added up to 810 oxycodone and 210 hydrocodone which is 1020 tablets of narcotics.Marland Kitchen

## 2018-10-12 NOTE — Telephone Encounter (Signed)
Please advise referral?  

## 2018-10-12 NOTE — Telephone Encounter (Signed)
Pt called saying she was supposed to get an injection for her back tomorrow but they cancelled her appt due to the coronavirus. She had  A call from her orthopedic office of Dr. Cleophas Dunker and spoke to an assistant to the doctor.  She said he was very rude to her to her about her pain medications.  She said she did not call them about pain medications.  She does not want to go back to them and would like a referral to another orthopedic office.  CB#  [phone]   Thanks Barth Kirks

## 2018-10-12 NOTE — Telephone Encounter (Signed)
ESCRIBED

## 2018-10-13 ENCOUNTER — Other Ambulatory Visit: Payer: BLUE CROSS/BLUE SHIELD

## 2018-10-13 NOTE — Telephone Encounter (Signed)
Referral placed.

## 2018-10-16 ENCOUNTER — Encounter: Payer: Self-pay | Admitting: Physician Assistant

## 2018-10-16 DIAGNOSIS — G9520 Unspecified cord compression: Secondary | ICD-10-CM

## 2018-10-16 MED ORDER — LIDOCAINE 5 % EX PTCH: 1.0000 | MEDICATED_PATCH | CUTANEOUS | 0 refills | Status: DC

## 2018-10-25 ENCOUNTER — Encounter: Payer: Self-pay | Admitting: Physician Assistant

## 2018-10-25 DIAGNOSIS — G3189 Other specified degenerative diseases of nervous system: Secondary | ICD-10-CM

## 2018-10-25 DIAGNOSIS — G9529 Other cord compression: Secondary | ICD-10-CM

## 2018-10-25 DIAGNOSIS — M4710 Other spondylosis with myelopathy, site unspecified: Secondary | ICD-10-CM

## 2018-10-25 DIAGNOSIS — M5416 Radiculopathy, lumbar region: Secondary | ICD-10-CM

## 2018-10-25 MED ORDER — OXYCODONE-ACETAMINOPHEN 7.5-325 MG PO TABS: 1.0000 | ORAL_TABLET | Freq: Four times a day (QID) | ORAL | 0 refills | Status: DC | PRN

## 2018-11-08 ENCOUNTER — Other Ambulatory Visit: Payer: Self-pay | Admitting: Neurosurgery

## 2018-11-08 DIAGNOSIS — M4310 Spondylolisthesis, site unspecified: Principal | ICD-10-CM

## 2018-11-08 DIAGNOSIS — M4300 Spondylolysis, site unspecified: Secondary | ICD-10-CM

## 2018-11-14 ENCOUNTER — Other Ambulatory Visit: Payer: BLUE CROSS/BLUE SHIELD

## 2018-11-16 ENCOUNTER — Telehealth: Payer: Self-pay | Admitting: Physician Assistant

## 2018-11-16 NOTE — Telephone Encounter (Signed)
No she can call and cancel Dr. Yves Dill and just f/u with neurosurgery.

## 2018-11-16 NOTE — Telephone Encounter (Signed)
Pt was referred to Dr Yves Dill for low back pain and would like your input as to weather she still needs referral since her orthopaedic doctor has made referral to a neurosurgeon .She will do whatever you feel is necessary

## 2018-11-16 NOTE — Telephone Encounter (Signed)
LMOVM for pt to return call 

## 2018-11-16 NOTE — Telephone Encounter (Signed)
Please advise 

## 2018-11-20 ENCOUNTER — Encounter: Payer: Self-pay | Admitting: Physician Assistant

## 2018-11-20 DIAGNOSIS — G9529 Other cord compression: Principal | ICD-10-CM

## 2018-11-20 DIAGNOSIS — G3189 Other specified degenerative diseases of nervous system: Principal | ICD-10-CM

## 2018-11-20 DIAGNOSIS — M4710 Other spondylosis with myelopathy, site unspecified: Secondary | ICD-10-CM

## 2018-11-20 MED ORDER — OXYCODONE-ACETAMINOPHEN 5-325 MG PO TABS: 1.0000 | ORAL_TABLET | Freq: Four times a day (QID) | ORAL | 0 refills | Status: DC | PRN

## 2018-11-20 NOTE — Telephone Encounter (Signed)
Patient was advised.  

## 2018-11-27 ENCOUNTER — Encounter: Payer: Self-pay | Admitting: Physician Assistant

## 2018-11-27 ENCOUNTER — Ambulatory Visit: Payer: BLUE CROSS/BLUE SHIELD

## 2018-11-27 DIAGNOSIS — Z6834 Body mass index (BMI) 34.0-34.9, adult: Secondary | ICD-10-CM

## 2018-11-27 DIAGNOSIS — E6609 Other obesity due to excess calories: Secondary | ICD-10-CM

## 2018-11-27 DIAGNOSIS — R41840 Attention and concentration deficit: Secondary | ICD-10-CM

## 2018-11-27 DIAGNOSIS — Z713 Dietary counseling and surveillance: Secondary | ICD-10-CM

## 2018-11-27 MED ORDER — PHENTERMINE HCL 37.5 MG PO TABS: 37.5000 mg | ORAL_TABLET | Freq: Every day | ORAL | 0 refills | Status: DC

## 2018-11-28 ENCOUNTER — Telehealth: Payer: Self-pay

## 2018-11-28 MED ORDER — PHENTERMINE HCL 37.5 MG PO TABS: 37.5000 mg | ORAL_TABLET | Freq: Every day | ORAL | 0 refills | Status: DC

## 2018-11-28 NOTE — Telephone Encounter (Signed)
Pharmacist Kinko from [facility] in [place] called to get a verbal ok to fill patient's prescription for Phentermine 5 days early. Patient is going out of town and wants to get the prescription filled before she leaves. Please advise.  Per Pharmacy Last fill date was 11/06/2018.   pharmacy call back [phone]

## 2018-11-28 NOTE — Telephone Encounter (Signed)
This is ok

## 2018-11-28 NOTE — Telephone Encounter (Signed)
Done

## 2018-11-28 NOTE — Addendum Note (Signed)
Addended by: Margaretann Loveless on: 11/28/2018 01:47 PM   Modules accepted: Orders

## 2018-12-05 ENCOUNTER — Other Ambulatory Visit: Payer: Self-pay | Admitting: Physician Assistant

## 2018-12-05 DIAGNOSIS — E559 Vitamin D deficiency, unspecified: Secondary | ICD-10-CM

## 2018-12-12 ENCOUNTER — Ambulatory Visit
Admission: RE | Admit: 2018-12-12 | Discharge: 2018-12-12 | Disposition: A | Discharge status: Home / Self Care | Payer: BLUE CROSS/BLUE SHIELD | Source: Ambulatory Visit | Attending: Orthopaedic Surgery | Admitting: Orthopaedic Surgery

## 2018-12-12 ENCOUNTER — Other Ambulatory Visit: Payer: Self-pay

## 2018-12-12 DIAGNOSIS — G8929 Other chronic pain: Secondary | ICD-10-CM

## 2018-12-12 MED ORDER — METHYLPREDNISOLONE ACETATE 40 MG/ML INJ SUSP (RADIOLOG
120.0000 mg | Freq: Once | INTRAMUSCULAR | Status: AC
  Administered 2018-12-12: 120 mg via EPIDURAL

## 2018-12-12 MED ORDER — IOPAMIDOL (ISOVUE-M 200) INJECTION 41%
1.0000 mL | Freq: Once | INTRAMUSCULAR | Status: AC
  Administered 2018-12-12: 15:00:00 1 mL via EPIDURAL

## 2018-12-12 NOTE — Discharge Instructions (Signed)

## 2018-12-13 ENCOUNTER — Encounter: Payer: Self-pay | Admitting: Physician Assistant

## 2018-12-15 ENCOUNTER — Other Ambulatory Visit: Payer: Self-pay

## 2018-12-15 ENCOUNTER — Encounter: Payer: Self-pay | Admitting: Physician Assistant

## 2018-12-15 ENCOUNTER — Ambulatory Visit: Payer: BLUE CROSS/BLUE SHIELD

## 2018-12-15 DIAGNOSIS — M4710 Other spondylosis with myelopathy, site unspecified: Secondary | ICD-10-CM

## 2018-12-15 MED ORDER — OXYCODONE-ACETAMINOPHEN 5-325 MG PO TABS: 1.0000 | ORAL_TABLET | Freq: Four times a day (QID) | ORAL | 0 refills | Status: DC | PRN

## 2018-12-15 NOTE — Telephone Encounter (Signed)
Patient called requesting refills. She is a Programmer, systems patient. Please review. Thanks!

## 2019-01-03 ENCOUNTER — Ambulatory Visit: Payer: BC Managed Care – PPO | Attending: Neurosurgery

## 2019-01-09 ENCOUNTER — Encounter: Payer: Self-pay | Admitting: Physician Assistant

## 2019-01-12 ENCOUNTER — Telehealth: Payer: Self-pay | Admitting: *Deleted

## 2019-01-12 ENCOUNTER — Encounter: Payer: Self-pay | Admitting: Physician Assistant

## 2019-01-12 DIAGNOSIS — M4710 Other spondylosis with myelopathy, site unspecified: Secondary | ICD-10-CM

## 2019-01-14 ENCOUNTER — Encounter: Payer: Self-pay | Admitting: Physician Assistant

## 2019-01-15 MED ORDER — OXYCODONE-ACETAMINOPHEN 5-325 MG PO TABS: 1.0000 | ORAL_TABLET | Freq: Four times a day (QID) | ORAL | 0 refills | Status: DC | PRN

## 2019-01-15 NOTE — Telephone Encounter (Signed)
done

## 2019-02-01 ENCOUNTER — Other Ambulatory Visit: Payer: Self-pay | Admitting: Physician Assistant

## 2019-02-01 DIAGNOSIS — F329 Major depressive disorder, single episode, unspecified: Secondary | ICD-10-CM

## 2019-02-01 DIAGNOSIS — F32A Depression, unspecified: Secondary | ICD-10-CM

## 2019-02-08 NOTE — Progress Notes (Signed)
Patient: Casey Carter Female    DOB: 1976-01-10   43 y.o.   MRN: [id number] Visit Date: 02/09/2019  Today's Provider: Mar Daring, PA-C   No chief complaint on file.  Subjective:     HPI   Patient is here to discuss chronic back pain, possibly surgery. She has been seeing Dr. Durward Fortes, orthopedic surgery, and has had 3 ESI. The first one was the most effective. By the 3rd it did not work hardly at all. She is curious of what her next options are. She knows something has to be done because she is having to take the Percocet 5-325mg  more frequently after any activities now.   Allergies  Allergen Reactions   Lavender Oil Rash     Current Outpatient Medications:    buPROPion (WELLBUTRIN XL) 150 MG 24 hr tablet, TAKE 1 TABLET(150 MG) BY MOUTH DAILY, Disp: 90 tablet, Rfl: 1   escitalopram (LEXAPRO) 20 MG tablet, TAKE 1 TABLET(20 MG) BY MOUTH DAILY, Disp: 90 tablet, Rfl: 1   lidocaine (LIDODERM) 5 %, Place 1 patch onto the skin daily. Remove & Discard patch within 12 hours or as directed by MD, Disp: 30 patch, Rfl: 0   meloxicam (MOBIC) 7.5 MG tablet, TK 1 T PO BID, Disp: , Rfl:    omeprazole (PRILOSEC) 10 MG capsule, Take 10 mg by mouth daily., Disp: , Rfl:    oxyCODONE-acetaminophen (PERCOCET/ROXICET) 5-325 MG tablet, Take 1 tablet by mouth every 6 (six) hours as needed for severe pain., Disp: 120 tablet, Rfl: 0   Vitamin D, Ergocalciferol, (DRISDOL) 1.25 MG (50000 UT) CAPS capsule, TAKE 1 CAPSULE BY MOUTH EVERY 7 DAYS, Disp: 12 capsule, Rfl: 0   albuterol (PROVENTIL HFA;VENTOLIN HFA) 108 (90 Base) MCG/ACT inhaler, Inhale 2 puffs into the lungs every 6 (six) hours as needed for wheezing or shortness of breath. (Patient not taking: Reported on 02/09/2019), Disp: 1 Inhaler, Rfl: 0   azithromycin (ZITHROMAX) 250 MG tablet, Take 2 tablets PO on day one, and one tablet PO daily thereafter until completed., Disp: 6 tablet, Rfl: 0   fluticasone (FLOVENT HFA) 110  MCG/ACT inhaler, Inhale 2 puffs into the lungs daily. (Patient not taking: Reported on 02/09/2019), Disp: 1 Inhaler, Rfl: 12   methocarbamol (ROBAXIN) 500 MG tablet, Take 1 tablet (500 mg total) by mouth every 8 (eight) hours as needed for muscle spasms. (Patient not taking: Reported on 09/06/2018), Disp: 90 tablet, Rfl: 1   nystatin (MYCOSTATIN) 100000 UNIT/ML suspension, Take 5 mLs (500,000 Units total) by mouth 4 (four) times daily. Swish and spit (Patient not taking: Reported on 02/09/2019), Disp: 60 mL, Rfl: 0   pantoprazole (PROTONIX) 40 MG tablet, TAKE 1 TABLET(40 MG) BY MOUTH DAILY (Patient not taking: Reported on 02/09/2019), Disp: 90 tablet, Rfl: 1   phentermine (ADIPEX-P) 37.5 MG tablet, Take 1 tablet (37.5 mg total) by mouth daily before breakfast. (Patient not taking: Reported on 02/09/2019), Disp: 30 tablet, Rfl: 0   predniSONE (DELTASONE) 20 MG tablet, Take 1 tablet (20 mg total) by mouth daily with breakfast. (Patient not taking: Reported on 02/09/2019), Disp: 5 tablet, Rfl: 0   promethazine-dextromethorphan (PROMETHAZINE-DM) 6.25-15 MG/5ML syrup, Take 5 mLs by mouth 4 (four) times daily as needed for cough. (Patient not taking: Reported on 02/09/2019), Disp: 120 mL, Rfl: 0   ranitidine (ZANTAC) 150 MG tablet, Take 1 tablet (150 mg total) by mouth at bedtime. (Patient not taking: Reported on 02/09/2019), Disp: 90 tablet, Rfl: 1  Review  of Systems  Constitutional: Negative for appetite change, chills, fatigue and fever.  Respiratory: Negative for chest tightness and shortness of breath.   Cardiovascular: Negative for chest pain and palpitations.  Gastrointestinal: Negative for abdominal pain, nausea and vomiting.  Musculoskeletal: Positive for back pain, gait problem and myalgias.  Neurological: Positive for weakness and numbness. Negative for dizziness.    Social History   Tobacco Use   Smoking status: Never Smoker   Smokeless tobacco: Never Used  Substance Use Topics    Alcohol use: Yes    Alcohol/week: 7.0 standard drinks    Types: 7 Glasses of wine per week    Frequency: Never      Objective:   BP (!) 145/97 (BP Location: Right Arm, Patient Position: Sitting, Cuff Size: Large)    Pulse 93    Temp 98.3 F (36.8 C) (Oral)    Resp 16    Ht 5\' 7"  (1.702 m)    Wt 222 lb (100.7 kg)    SpO2 96%    BMI 34.77 kg/m  Vitals:   02/09/19 1338  BP: (!) 145/97  Pulse: 93  Resp: 16  Temp: 98.3 F (36.8 C)  TempSrc: Oral  SpO2: 96%  Weight: 222 lb (100.7 kg)  Height: 5\' 7"  (1.702 m)     Physical Exam Vitals signs reviewed.  Constitutional:      General: She is not in acute distress.    Appearance: Normal appearance. She is well-developed. She is obese. She is not ill-appearing.  HENT:     Head: Normocephalic and atraumatic.  Neck:     Musculoskeletal: Normal range of motion and neck supple.  Pulmonary:     Effort: Pulmonary effort is normal. No respiratory distress.  Neurological:     Mental Status: She is alert.  Psychiatric:        Mood and Affect: Mood normal.        Behavior: Behavior normal.        Thought Content: Thought content normal.        Judgment: Judgment normal.    CLINICAL DATA:  Low back and bilateral leg pain for the past 4 months.  EXAM: MRI LUMBAR SPINE WITHOUT CONTRAST  TECHNIQUE: Multiplanar, multisequence MR imaging of the lumbar spine was performed. No intravenous contrast was administered.  COMPARISON:  Lumbar spine x-rays dated March 28, 2018.  FINDINGS: Segmentation:  Standard.  Alignment:  4 mm anterolisthesis at L5-S1.  Vertebrae:  No fracture, evidence of discitis, or bone lesion.  Conus medullaris and cauda equina: Conus extends to the L1 level. Conus and cauda equina appear normal.  Paraspinal and other soft tissues: Negative.  Disc levels:  T11-T12: Only seen on the sagittal images.  Negative.  T12-L1:  Negative.  L1-L2:  Negative.  L2-L3:  Negative.  L3-L4:  Small  broad-based disc protrusion.  No stenosis.  L4-L5: Small broad-based disc protrusion. Moderate right facet arthropathy. No stenosis.  L5-S1: Severe bilateral facet arthropathy with small joint effusions. Disc uncovering. Mild bilateral neuroforaminal stenosis.  IMPRESSION: 1. Severe bilateral facet arthropathy at L5-S1 with grade 1 anterolisthesis and mild bilateral neuroforaminal stenosis. 2. Mild degenerative disc disease at L3-L4 and L4-L5 without stenosis.   Electronically Signed   By: Obie DredgeWilliam T Derry M.D.   On: 05/12/2018 14:26  No results found for any visits on 02/09/19.     Assessment & Plan    1. 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 Patient agreeable to Neurosurgery referral at this time. [facility]. I will continue Percocet Rx as below. NCCSR reviewed. Patient only has filled by me.  - Ambulatory referral to Neurosurgery - oxyCODONE-acetaminophen (PERCOCET/ROXICET) 5-325 MG tablet; Take 1 tablet by mouth every 4 (four) hours as needed for severe pain.  Dispense: 150 tablet; Refill: 0  2. Gastroesophageal reflux disease without esophagitis Stable. Diagnosis pulled for medication refill. Continue current medical treatment plan. - omeprazole (PRILOSEC) 20 MG capsule; Take 1 capsule (20 mg total) by mouth daily.  Dispense: 90 capsule; Refill: 3  3. Allergic rhinitis due to animal hair and dander Using claritin and zyrtec interchangeably. Has a new kitten in the home which she knows she is allergic too, but will not re-home the kitten. Will add Singulair as below.  - montelukast (SINGULAIR) 10 MG tablet; Take 1 tablet (10 mg total) by mouth at bedtime.  Dispense: 90 tablet; Refill: 3     Margaretann LovelessJennifer M Wilsie Kern, PA-C  [facility]

## 2019-02-09 ENCOUNTER — Ambulatory Visit: Payer: BC Managed Care – PPO | Admitting: Physician Assistant

## 2019-02-09 ENCOUNTER — Other Ambulatory Visit: Payer: Self-pay

## 2019-02-09 ENCOUNTER — Encounter: Payer: Self-pay | Admitting: Physician Assistant

## 2019-02-09 VITALS — BP 143/91 | HR 92 | Temp 98.3°F | Resp 16 | Ht 67.0 in | Wt 222.0 lb

## 2019-02-09 DIAGNOSIS — G3189 Other specified degenerative diseases of nervous system: Secondary | ICD-10-CM | POA: Diagnosis not present

## 2019-02-09 DIAGNOSIS — J3081 Allergic rhinitis due to animal (cat) (dog) hair and dander: Secondary | ICD-10-CM | POA: Diagnosis not present

## 2019-02-09 DIAGNOSIS — G9529 Other cord compression: Secondary | ICD-10-CM | POA: Diagnosis not present

## 2019-02-09 DIAGNOSIS — K219 Gastro-esophageal reflux disease without esophagitis: Secondary | ICD-10-CM

## 2019-02-09 DIAGNOSIS — M4710 Other spondylosis with myelopathy, site unspecified: Secondary | ICD-10-CM

## 2019-02-09 MED ORDER — OXYCODONE-ACETAMINOPHEN 5-325 MG PO TABS: 1.0000 | ORAL_TABLET | ORAL | 0 refills | Status: DC | PRN

## 2019-02-09 MED ORDER — OXYCODONE-ACETAMINOPHEN 5-325 MG PO TABS: 1.0000 | ORAL_TABLET | Freq: Four times a day (QID) | ORAL | 0 refills | Status: DC | PRN

## 2019-02-09 MED ORDER — MONTELUKAST SODIUM 10 MG PO TABS: 10.0000 mg | ORAL_TABLET | Freq: Every day | ORAL | 3 refills | Status: DC

## 2019-02-09 MED ORDER — OMEPRAZOLE 20 MG PO CPDR: 20.0000 mg | DELAYED_RELEASE_CAPSULE | Freq: Every day | ORAL | 3 refills | Status: DC

## 2019-02-27 ENCOUNTER — Other Ambulatory Visit: Payer: Self-pay | Admitting: Physician Assistant

## 2019-02-27 DIAGNOSIS — E559 Vitamin D deficiency, unspecified: Secondary | ICD-10-CM

## 2019-03-08 ENCOUNTER — Encounter: Payer: Self-pay | Admitting: Physician Assistant

## 2019-03-08 DIAGNOSIS — M4710 Other spondylosis with myelopathy, site unspecified: Secondary | ICD-10-CM

## 2019-03-08 MED ORDER — OXYCODONE-ACETAMINOPHEN 5-325 MG PO TABS: 1.0000 | ORAL_TABLET | ORAL | 0 refills | Status: DC | PRN

## 2019-04-03 ENCOUNTER — Encounter: Payer: Self-pay | Admitting: Physician Assistant

## 2019-04-03 DIAGNOSIS — M4710 Other spondylosis with myelopathy, site unspecified: Secondary | ICD-10-CM

## 2019-04-03 DIAGNOSIS — R3989 Other symptoms and signs involving the genitourinary system: Secondary | ICD-10-CM

## 2019-04-03 MED ORDER — SULFAMETHOXAZOLE-TRIMETHOPRIM 800-160 MG PO TABS: 1.0000 | ORAL_TABLET | Freq: Two times a day (BID) | ORAL | 0 refills | Status: DC

## 2019-04-04 MED ORDER — OXYCODONE-ACETAMINOPHEN 5-325 MG PO TABS: 1.0000 | ORAL_TABLET | ORAL | 0 refills | Status: DC | PRN

## 2019-04-04 NOTE — Addendum Note (Signed)
Addended by: Mar Daring on: 04/04/2019 03:18 PM   Modules accepted: Orders

## 2019-04-18 ENCOUNTER — Telehealth: Payer: Self-pay | Admitting: Physician Assistant

## 2019-04-18 NOTE — Telephone Encounter (Signed)
FYI:  Pt made an appt with Tawanna Sat tomorrow at 8:20 am with an injury to her left foot.  It snapped and she cannot put any weight on it.   Pt will need to be wheelchaired up to her appt around 8:10 am tomorrow morning.  Thanks, [employer]

## 2019-04-19 ENCOUNTER — Ambulatory Visit: Payer: BC Managed Care – PPO | Admitting: Physician Assistant

## 2019-04-19 ENCOUNTER — Ambulatory Visit
Admission: RE | Admit: 2019-04-19 | Discharge: 2019-04-19 | Disposition: A | Discharge status: Home / Self Care | Payer: BC Managed Care – PPO | Source: Ambulatory Visit | Attending: Physician Assistant | Admitting: Physician Assistant

## 2019-04-19 ENCOUNTER — Encounter: Payer: Self-pay | Admitting: Physician Assistant

## 2019-04-19 ENCOUNTER — Ambulatory Visit
Admission: RE | Admit: 2019-04-19 | Discharge: 2019-04-19 | Disposition: A | Discharge status: Home / Self Care | Payer: BC Managed Care – PPO | Attending: Physician Assistant | Admitting: Physician Assistant

## 2019-04-19 ENCOUNTER — Other Ambulatory Visit: Payer: Self-pay

## 2019-04-19 ENCOUNTER — Telehealth: Payer: Self-pay | Admitting: Physician Assistant

## 2019-04-19 VITALS — BP 139/80 | HR 89 | Temp 97.1°F | Resp 16

## 2019-04-19 DIAGNOSIS — S99912A Unspecified injury of left ankle, initial encounter: Secondary | ICD-10-CM

## 2019-04-19 DIAGNOSIS — N3000 Acute cystitis without hematuria: Secondary | ICD-10-CM

## 2019-04-19 DIAGNOSIS — E6609 Other obesity due to excess calories: Secondary | ICD-10-CM | POA: Diagnosis not present

## 2019-04-19 DIAGNOSIS — Z6834 Body mass index (BMI) 34.0-34.9, adult: Secondary | ICD-10-CM

## 2019-04-19 DIAGNOSIS — Z713 Dietary counseling and surveillance: Secondary | ICD-10-CM

## 2019-04-19 MED ORDER — METHYLPREDNISOLONE 4 MG PO TBPK: ORAL_TABLET | ORAL | 0 refills | Status: DC

## 2019-04-19 MED ORDER — PHENTERMINE HCL 37.5 MG PO TABS: 37.5000 mg | ORAL_TABLET | Freq: Every day | ORAL | 2 refills | Status: DC

## 2019-04-19 MED ORDER — NITROFURANTOIN MONOHYD MACRO 100 MG PO CAPS: 100.0000 mg | ORAL_CAPSULE | Freq: Two times a day (BID) | ORAL | 0 refills | Status: DC

## 2019-04-19 NOTE — Telephone Encounter (Signed)
Pt called regarding the xray on her foot that she had this am.  CB#  [phone]  Con [place]os

## 2019-04-19 NOTE — Telephone Encounter (Signed)
Noted  

## 2019-04-19 NOTE — Addendum Note (Signed)
Addended by: Mar Daring on: 04/19/2019 05:18 PM   Modules accepted: Orders

## 2019-04-19 NOTE — Progress Notes (Signed)
Patient: Casey Carter Female    DOB: 15-Oct-1975   43 y.o.   MRN: [id number] Visit Date: 04/19/2019  Today's Provider: Margaretann Loveless, PA-C   Chief Complaint  Patient presents with  . Foot Injury    Left   Subjective:     Foot Injury  The incident occurred 2 days ago. The pain is present in the left foot and left heel. The quality of the pain is described as burning and aching. The pain is at a severity of 7/10. The pain is moderate. Associated symptoms include an inability to bear weight. Pertinent negatives include no loss of motion, loss of sensation, muscle weakness, numbness or tingling. She reports no foreign bodies present. The symptoms are aggravated by movement and weight bearing. The treatment provided no relief.    Patient has had left foot pain for 2 days. Patient stepped back a certain way on left foot 2 days ago and felt a pop in her foot. Patient states she has been having moderate pain every time she puts pressure on her foot. Patient has been taking her prescribed pain medications with no relief.     Allergies  Allergen Reactions  . Lavender Oil Rash     Current Outpatient Medications:  .  buPROPion (WELLBUTRIN XL) 150 MG 24 hr tablet, TAKE 1 TABLET(150 MG) BY MOUTH DAILY, Disp: 90 tablet, Rfl: 1 .  escitalopram (LEXAPRO) 20 MG tablet, TAKE 1 TABLET(20 MG) BY MOUTH DAILY, Disp: 90 tablet, Rfl: 1 .  montelukast (SINGULAIR) 10 MG tablet, Take 1 tablet (10 mg total) by mouth at bedtime., Disp: 90 tablet, Rfl: 3 .  omeprazole (PRILOSEC) 20 MG capsule, Take 1 capsule (20 mg total) by mouth daily., Disp: 90 capsule, Rfl: 3 .  oxyCODONE-acetaminophen (PERCOCET/ROXICET) 5-325 MG tablet, Take 1 tablet by mouth every 4 (four) hours as needed for severe pain., Disp: 150 tablet, Rfl: 0 .  Vitamin D, Ergocalciferol, (DRISDOL) 1.25 MG (50000 UT) CAPS capsule, TAKE 1 CAPSULE BY MOUTH EVERY 7 DAYS, Disp: 12 capsule, Rfl: 0 .  albuterol (PROVENTIL HFA;VENTOLIN HFA)  108 (90 Base) MCG/ACT inhaler, Inhale 2 puffs into the lungs every 6 (six) hours as needed for wheezing or shortness of breath. (Patient not taking: Reported on 02/09/2019), Disp: 1 Inhaler, Rfl: 0 .  fluticasone (FLOVENT HFA) 110 MCG/ACT inhaler, Inhale 2 puffs into the lungs daily. (Patient not taking: Reported on 02/09/2019), Disp: 1 Inhaler, Rfl: 12 .  lidocaine (LIDODERM) 5 %, Place 1 patch onto the skin daily. Remove & Discard patch within 12 hours or as directed by MD (Patient not taking: Reported on 04/19/2019), Disp: 30 patch, Rfl: 0 .  meloxicam (MOBIC) 7.5 MG tablet, TK 1 T PO BID, Disp: , Rfl:  .  methocarbamol (ROBAXIN) 500 MG tablet, Take 1 tablet (500 mg total) by mouth every 8 (eight) hours as needed for muscle spasms. (Patient not taking: Reported on 09/06/2018), Disp: 90 tablet, Rfl: 1 .  pantoprazole (PROTONIX) 40 MG tablet, TAKE 1 TABLET(40 MG) BY MOUTH DAILY (Patient not taking: Reported on 02/09/2019), Disp: 90 tablet, Rfl: 1 .  phentermine (ADIPEX-P) 37.5 MG tablet, Take 1 tablet (37.5 mg total) by mouth daily before breakfast. (Patient not taking: Reported on 02/09/2019), Disp: 30 tablet, Rfl: 0 .  sulfamethoxazole-trimethoprim (BACTRIM DS) 800-160 MG tablet, Take 1 tablet by mouth 2 (two) times daily. (Patient not taking: Reported on 04/19/2019), Disp: 10 tablet, Rfl: 0  Review of Systems  Constitutional: Negative for  appetite change, chills, fatigue and fever.  Respiratory: Negative for chest tightness and shortness of breath.   Cardiovascular: Negative for chest pain and palpitations.  Gastrointestinal: Negative for abdominal pain, nausea and vomiting.  Musculoskeletal: Positive for arthralgias, gait problem and joint swelling.  Neurological: Negative for dizziness, tingling, weakness and numbness.    Social History   Tobacco Use  . Smoking status: Never Smoker  . Smokeless tobacco: Never Used  Substance Use Topics  . Alcohol use: Yes    Alcohol/week: 7.0 standard drinks     Types: 7 Glasses of wine per week    Frequency: Never      Objective:   BP 139/80 (BP Location: Left Arm, Patient Position: Sitting, Cuff Size: Large)   Pulse 89   Temp (!) 97.1 F (36.2 C) (Other (Comment))   Resp 16   SpO2 96%  Vitals:   04/19/19 0847  BP: 139/80  Pulse: 89  Resp: 16  Temp: (!) 97.1 F (36.2 C)  TempSrc: Other (Comment)  SpO2: 96%  There is no height or weight on file to calculate BMI.   Physical Exam Vitals signs reviewed.  Constitutional:      General: She is not in acute distress.    Appearance: Normal appearance. She is well-developed. She is not ill-appearing or diaphoretic.  Neck:     Musculoskeletal: Normal range of motion and neck supple.     Thyroid: No thyromegaly.     Vascular: No JVD.     Trachea: No tracheal deviation.  Cardiovascular:     Rate and Rhythm: Normal rate and regular rhythm.     Heart sounds: Normal heart sounds. No murmur. No friction rub. No gallop.   Pulmonary:     Effort: Pulmonary effort is normal. No respiratory distress.     Breath sounds: Normal breath sounds. No wheezing or rales.  Musculoskeletal:     Left ankle: She exhibits swelling. She exhibits normal range of motion and normal pulse. Tenderness (only over area circled and some mild tenderness over the head of the 5th). Head of 5th metatarsal tenderness found. Achilles tendon normal.       Feet:  Lymphadenopathy:     Cervical: No cervical adenopathy.  Neurological:     Mental Status: She is alert.      No results found for any visits on 04/19/19.     Assessment & Plan    1. Injury of left ankle, initial encounter Will get imaging as below to determine whether to refer to podiatry or orthopedics. Once I get imaging results I will try to get a stat referral for her since she is unable to bear weight. Continue Norco that you have for back pain and IBU prn. Ice 20 minutes, elevate and use compression.  - DG Ankle Complete Left; Future - DG Foot  Complete Left; Future  2. Acute cystitis without hematuria Did not resolve with Bactrim, but patient was only able to take one tab daily instead of BID dosing due to causing insomnia per patient. Will change therapy to Macrobid as below. Call if still not resolving.  - nitrofurantoin, macrocrystal-monohydrate, (MACROBID) 100 MG capsule; Take 1 capsule (100 mg total) by mouth 2 (two) times daily.  Dispense: 14 capsule; Refill: 0  3. Class 1 obesity due to excess calories with serious comorbidity and body mass index (BMI) of 34.0 to 34.9 in adult Will restart phentermine to help with weight loss again. She has recently started a 1200 calorie diet. Will start  walking again as soon as she is able.  - phentermine (ADIPEX-P) 37.5 MG tablet; Take 1 tablet (37.5 mg total) by mouth daily before breakfast.  Dispense: 30 tablet; Refill: 2  4. Encounter for weight loss counseling See above medical treatment plan. - phentermine (ADIPEX-P) 37.5 MG tablet; Take 1 tablet (37.5 mg total) by mouth daily before breakfast.  Dispense: 30 tablet; Refill: 2     Margaretann LovelessJennifer M Rubens [place]ston, PA-C  [facility]

## 2019-04-19 NOTE — Patient Instructions (Signed)
Ankle Pain The ankle joint holds your body weight and allows you to move around. Ankle pain can occur on either side or the back of one ankle or both ankles. Ankle pain may be sharp and burning or dull and aching. There may be tenderness, stiffness, redness, or warmth around the ankle. Many things can cause ankle pain, including an injury to the area and overuse of the ankle. Follow these instructions at home: Activity  Rest your ankle as told by your health care provider. Avoid any activities that cause ankle pain.  Do not use the injured limb to support your body weight until your health care provider says that you can. Use crutches as told by your health care provider.  Do exercises as told by your health care provider.  Ask your health care provider when it is safe to drive if you have a brace on your ankle. If you have a brace:  Wear the brace as told by your health care provider. Remove it only as told by your health care provider.  Loosen the brace if your toes tingle, become numb, or turn cold and blue.  Keep the brace clean.  If the brace is not waterproof: ? Do not let it get wet. ? Cover it with a watertight covering when you take a bath or shower. If you were given an elastic bandage:   Remove it when you take a bath or a shower.  Try not to move your ankle very much, but wiggle your toes from time to time. This helps to prevent swelling.  Adjust the bandage to make it more comfortable if it feels too tight.  Loosen the bandage if you have numbness or tingling in your foot or if your foot turns cold and blue. Managing pain, stiffness, and swelling   If directed, put ice on the painful area. ? If you have a removable brace or elastic bandage, remove it as told by your health care provider. ? Put ice in a plastic bag. ? Place a towel between your skin and the bag. ? Leave the ice on for 20 minutes, 2-3 times a day.  Move your toes often to avoid stiffness and to  lessen swelling.  Raise (elevate) your ankle above the level of your heart while you are sitting or lying down. General instructions  Record information about your pain. Writing down the following may be helpful for you and your health care provider: ? How often you have ankle pain. ? Where the pain is located. ? What the pain feels like.  If treatment involves wearing a prescribed shoe or insole, make sure you wear it correctly and for as long as told by your health care provider.  Take over-the-counter and prescription medicines only as told by your health care provider.  Keep all follow-up visits as told by your health care provider. This is important. Contact a health care provider if:  Your pain gets worse.  Your pain is not relieved with medicines.  You have a fever or chills.  You are having more trouble with walking.  You have new symptoms. Get help right away if:  Your foot, leg, toes, or ankle: ? Tingles or becomes numb. ? Becomes swollen. ? Turns pale or blue. Summary  Ankle pain can occur on either side or the back of one ankle or both ankles.  Ankle pain may be sharp and burning or dull and aching.  Rest your ankle as told by your health care provider.   If told, apply ice to the area.  Take over-the-counter and prescription medicines only as told by your health care provider. This information is not intended to replace advice given to you by your health care provider. Make sure you discuss any questions you have with your health care provider. Document Released: 12/30/2009 Document Revised: 10/31/2018 Document Reviewed: 01/18/2018 Elsevier Patient Education  2020 [employer]  

## 2019-04-20 NOTE — Telephone Encounter (Signed)
Sent via mychart

## 2019-04-20 NOTE — Telephone Encounter (Signed)
Please advise/result  

## 2019-04-20 NOTE — Addendum Note (Signed)
Addended by: Mar Daring on: 04/20/2019 09:01 AM   Modules accepted: Orders

## 2019-04-30 ENCOUNTER — Other Ambulatory Visit: Payer: Self-pay | Admitting: Physician Assistant

## 2019-04-30 DIAGNOSIS — S99912A Unspecified injury of left ankle, initial encounter: Secondary | ICD-10-CM

## 2019-05-01 ENCOUNTER — Encounter: Payer: Self-pay | Admitting: Physician Assistant

## 2019-05-01 DIAGNOSIS — M4710 Other spondylosis with myelopathy, site unspecified: Secondary | ICD-10-CM

## 2019-05-01 MED ORDER — OXYCODONE-ACETAMINOPHEN 5-325 MG PO TABS: 1.0000 | ORAL_TABLET | ORAL | 0 refills | Status: DC | PRN

## 2019-05-25 ENCOUNTER — Telehealth: Payer: Self-pay | Admitting: Physician Assistant

## 2019-05-25 ENCOUNTER — Encounter: Payer: Self-pay | Admitting: Physician Assistant

## 2019-05-25 ENCOUNTER — Other Ambulatory Visit: Payer: Self-pay | Admitting: Family Medicine

## 2019-05-25 DIAGNOSIS — M4710 Other spondylosis with myelopathy, site unspecified: Secondary | ICD-10-CM

## 2019-05-25 MED ORDER — OXYCODONE-ACETAMINOPHEN 5-325 MG PO TABS: 1.0000 | ORAL_TABLET | ORAL | 0 refills | Status: DC | PRN

## 2019-05-25 NOTE — Telephone Encounter (Signed)
Pt needing a refill on: oxyCODONE-acetaminophen (PERCOCET/ROXICET) 5-325 MG tablet - due on Mon  Please fill at:  [id number] - [place], [place] - [address] [phone] (Phone) [phone] (Fax)   Pt was asking if her back surgery has been cleared.  Please advise.  Thanks, [employer]

## 2019-05-25 NOTE — Telephone Encounter (Signed)
Refilled medication for a week until provider returns.

## 2019-06-04 ENCOUNTER — Encounter: Payer: Self-pay | Admitting: Physician Assistant

## 2019-06-04 DIAGNOSIS — M4710 Other spondylosis with myelopathy, site unspecified: Secondary | ICD-10-CM

## 2019-06-04 MED ORDER — OXYCODONE-ACETAMINOPHEN 5-325 MG PO TABS: 1.0000 | ORAL_TABLET | ORAL | 0 refills | Status: DC | PRN

## 2019-06-27 ENCOUNTER — Encounter: Payer: Self-pay | Admitting: Physician Assistant

## 2019-06-27 DIAGNOSIS — M4710 Other spondylosis with myelopathy, site unspecified: Secondary | ICD-10-CM

## 2019-06-28 MED ORDER — OXYCODONE-ACETAMINOPHEN 5-325 MG PO TABS: 1.0000 | ORAL_TABLET | ORAL | 0 refills | Status: DC | PRN

## 2019-07-13 ENCOUNTER — Encounter: Payer: Self-pay | Admitting: Physician Assistant

## 2019-07-13 DIAGNOSIS — Z713 Dietary counseling and surveillance: Secondary | ICD-10-CM

## 2019-07-13 DIAGNOSIS — E6609 Other obesity due to excess calories: Secondary | ICD-10-CM

## 2019-07-13 DIAGNOSIS — Z6834 Body mass index (BMI) 34.0-34.9, adult: Secondary | ICD-10-CM

## 2019-07-13 MED ORDER — PHENTERMINE HCL 37.5 MG PO TABS: 37.5000 mg | ORAL_TABLET | Freq: Every day | ORAL | 2 refills | Status: DC

## 2019-07-24 ENCOUNTER — Encounter: Payer: Self-pay | Admitting: Physician Assistant

## 2019-07-24 DIAGNOSIS — M4710 Other spondylosis with myelopathy, site unspecified: Secondary | ICD-10-CM

## 2019-07-24 MED ORDER — OXYCODONE-ACETAMINOPHEN 5-325 MG PO TABS: 1.0000 | ORAL_TABLET | ORAL | 0 refills | Status: DC | PRN

## 2019-07-31 ENCOUNTER — Ambulatory Visit: Payer: BC Managed Care – PPO | Attending: Internal Medicine

## 2019-07-31 DIAGNOSIS — Z20822 Contact with and (suspected) exposure to covid-19: Secondary | ICD-10-CM

## 2019-08-02 ENCOUNTER — Other Ambulatory Visit: Payer: Self-pay | Admitting: Physician Assistant

## 2019-08-02 DIAGNOSIS — F32A Depression, unspecified: Secondary | ICD-10-CM

## 2019-08-02 DIAGNOSIS — F329 Major depressive disorder, single episode, unspecified: Secondary | ICD-10-CM

## 2019-08-02 LAB — NOVEL CORONAVIRUS, NAA: SARS-CoV-2, NAA: NOT DETECTED

## 2019-08-17 ENCOUNTER — Encounter: Payer: Self-pay | Admitting: Physician Assistant

## 2019-08-17 DIAGNOSIS — M4710 Other spondylosis with myelopathy, site unspecified: Secondary | ICD-10-CM

## 2019-08-17 MED ORDER — OXYCODONE-ACETAMINOPHEN 5-325 MG PO TABS: 1.0000 | ORAL_TABLET | ORAL | 0 refills | Status: DC | PRN

## 2019-09-11 ENCOUNTER — Encounter: Payer: Self-pay | Admitting: Physician Assistant

## 2019-09-11 DIAGNOSIS — M4710 Other spondylosis with myelopathy, site unspecified: Secondary | ICD-10-CM

## 2019-09-11 MED ORDER — OXYCODONE-ACETAMINOPHEN 5-325 MG PO TABS: 1.0000 | ORAL_TABLET | ORAL | 0 refills | Status: DC | PRN

## 2019-10-04 ENCOUNTER — Encounter: Payer: Self-pay | Admitting: Physician Assistant

## 2019-10-04 DIAGNOSIS — M4710 Other spondylosis with myelopathy, site unspecified: Secondary | ICD-10-CM

## 2019-10-05 MED ORDER — OXYCODONE-ACETAMINOPHEN 7.5-325 MG PO TABS: 1.0000 | ORAL_TABLET | ORAL | 0 refills | Status: DC | PRN

## 2019-10-05 NOTE — Addendum Note (Signed)
Addended by: Margaretann Loveless on: 10/05/2019 07:18 AM   Modules accepted: Orders

## 2019-10-12 NOTE — Progress Notes (Signed)
Patient: Casey Carter, Female    DOB: April 24, 1976, 44 y.o.   MRN: [id number] Visit Date: 10/15/2019  Today's Provider: Mar Daring, PA-C   Chief Complaint  Patient presents with  . Annual Exam   Subjective:     Annual physical exam Casey Carter is a 44 y.o. female who presents today for health maintenance and complete physical. She feels well. She reports exercising none. She reports she is sleeping poorly. -----------------------------------------------------------------   Review of Systems  Constitutional: Negative.   HENT: Negative.   Eyes: Negative.   Respiratory: Negative.   Cardiovascular: Negative.   Gastrointestinal: Negative.   Endocrine: Positive for heat intolerance, polydipsia and polyuria.  Musculoskeletal: Negative.   Skin: Negative.   Allergic/Immunologic: Positive for environmental allergies.  Neurological: Negative.   Hematological: Negative.   Psychiatric/Behavioral: Positive for decreased concentration.    Social History      She  reports that she has never smoked. She has never used smokeless tobacco. She reports current alcohol use of about 7.0 standard drinks of alcohol per week. She reports that she does not use drugs.       Social History   Socioeconomic History  . Marital status: Legally Separated    Spouse name: Not on file  . Number of children: Not on file  . Years of education: Not on file  . Highest education level: Not on file  Occupational History  . Not on file  Tobacco Use  . Smoking status: Never Smoker  . Smokeless tobacco: Never Used  Substance and Sexual Activity  . Alcohol use: Yes    Alcohol/week: 7.0 standard drinks    Types: 7 Glasses of wine per week  . Drug use: Never  . Sexual activity: Not on file  Other Topics Concern  . Not on file  Social History Narrative  . Not on file   Social Determinants of Health   Financial Resource Strain:   . Difficulty of Paying Living Expenses:   Food  Insecurity:   . Worried About Charity fundraiser in the Last Year:   . Arboriculturist in the Last Year:   Transportation Needs:   . Film/video editor (Medical):   Marland Kitchen Lack of Transportation (Non-Medical):   Physical Activity:   . Days of Exercise per Week:   . Minutes of Exercise per Session:   Stress:   . Feeling of Stress :   Social Connections:   . Frequency of Communication with Friends and Family:   . Frequency of Social Gatherings with Friends and Family:   . Attends Religious Services:   . Active Member of Clubs or Organizations:   . Attends Archivist Meetings:   Marland Kitchen Marital Status:     Past Medical History:  Diagnosis Date  . Allergy   . Anxiety   . Depression   . Endometriosis   . 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 Thyroid disease      Patient Active Problem List   Diagnosis Date Noted  . 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 Vitamin D deficiency 11/10/2017  . GERD (gastroesophageal reflux disease) 11/04/2017  . 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 Acute depression 04/14/2015  . Panic 04/14/2015    History reviewed. No pertinent surgical history.  Family History        Family Status  Relation Name Status  .  Mother  Deceased  . MGM  (Not Specified)  . PGM  (Not Specified)  . PGF  (Not Specified)  . Other  (Not Specified)  . Father  Alive        Her family history includes Anxiety disorder in an other family member; Cancer in her maternal grandmother, mother, paternal grandfather, paternal grandmother, and another family member; Depression in her mother and another family member; Diabetes in her maternal grandmother and paternal grandmother; Stroke in her paternal grandfather; Thyroid disease in her mother and another family member.      Allergies  Allergen Reactions  . Lavender Oil Rash     Current Outpatient Medications:  .  albuterol (PROVENTIL  HFA;VENTOLIN HFA) 108 (90 Base) MCG/ACT inhaler, Inhale 2 puffs into the lungs every 6 (six) hours as needed for wheezing or shortness of breath. (Patient not taking: Reported on 02/09/2019), Disp: 1 Inhaler, Rfl: 0 .  buPROPion (WELLBUTRIN XL) 150 MG 24 hr tablet, TAKE 1 TABLET(150 MG) BY MOUTH DAILY, Disp: 90 tablet, Rfl: 1 .  escitalopram (LEXAPRO) 20 MG tablet, TAKE 1 TABLET(20 MG) BY MOUTH DAILY, Disp: 90 tablet, Rfl: 1 .  fluticasone (FLOVENT HFA) 110 MCG/ACT inhaler, Inhale 2 puffs into the lungs daily. (Patient not taking: Reported on 02/09/2019), Disp: 1 Inhaler, Rfl: 12 .  lidocaine (LIDODERM) 5 %, Place 1 patch onto the skin daily. Remove & Discard patch within 12 hours or as directed by MD (Patient not taking: Reported on 04/19/2019), Disp: 30 patch, Rfl: 0 .  meloxicam (MOBIC) 7.5 MG tablet, TK 1 T PO BID, Disp: , Rfl:  .  methocarbamol (ROBAXIN) 500 MG tablet, Take 1 tablet (500 mg total) by mouth every 8 (eight) hours as needed for muscle spasms. (Patient not taking: Reported on 09/06/2018), Disp: 90 tablet, Rfl: 1 .  methylPREDNISolone (MEDROL DOSEPAK) 4 MG TBPK tablet, TAKE 6 TABLETS ON DAY 1 AS DIRECTED ON PACKAGE AND DECREASE BY 1 TAB EACH DAY FOR A TOTAL OF 6 DAYS, Disp: 21 each, Rfl: 0 .  montelukast (SINGULAIR) 10 MG tablet, Take 1 tablet (10 mg total) by mouth at bedtime., Disp: 90 tablet, Rfl: 3 .  nitrofurantoin, macrocrystal-monohydrate, (MACROBID) 100 MG capsule, Take 1 capsule (100 mg total) by mouth 2 (two) times daily., Disp: 14 capsule, Rfl: 0 .  omeprazole (PRILOSEC) 20 MG capsule, Take 1 capsule (20 mg total) by mouth daily., Disp: 90 capsule, Rfl: 3 .  oxyCODONE-acetaminophen (PERCOCET) 7.5-325 MG tablet, Take 1 tablet by mouth every 4 (four) hours as needed for severe pain., Disp: 150 tablet, Rfl: 0 .  pantoprazole (PROTONIX) 40 MG tablet, TAKE 1 TABLET(40 MG) BY MOUTH DAILY (Patient not taking: Reported on 02/09/2019), Disp: 90 tablet, Rfl: 1 .  phentermine (ADIPEX-P) 37.5  MG tablet, Take 1 tablet (37.5 mg total) by mouth daily before breakfast., Disp: 30 tablet, Rfl: 2 .  Vitamin D, Ergocalciferol, (DRISDOL) 1.25 MG (50000 UT) CAPS capsule, TAKE 1 CAPSULE BY MOUTH EVERY 7 DAYS, Disp: 12 capsule, Rfl: 0   Patient Care Team: Reine Just as PCP - General (Family Medicine)    Objective:    Vitals: BP (!) 149/88 (BP Location: Left Arm, Patient Position: Sitting, Cuff Size: Large)   Pulse 100   Temp (!) 96.9 F (36.1 C) (Temporal)   Resp 16   Ht 5\' 7"  (1.702 m)   Wt 221 lb (100.2 kg)   BMI 34.61 kg/m    Vitals:   10/15/19 1359  BP: (!) 149/88  Pulse: 100  Resp: 16  Temp: (!) 96.9 F (36.1 C)  TempSrc: Temporal  Weight: 221 lb (100.2 kg)  Height: 5\' 7"  (1.702 m)     Physical Exam Vitals reviewed.  Constitutional:      General: She is not in acute distress.    Appearance: Normal appearance. She is well-developed. She is obese. She is not ill-appearing or diaphoretic.  HENT:     Head: Normocephalic and atraumatic.     Right Ear: Hearing, tympanic membrane, ear canal and external ear normal.     Left Ear: Hearing, tympanic membrane, ear canal and external ear normal.     Mouth/Throat:     Pharynx: Uvula midline.  Eyes:     General: No scleral icterus.       Right eye: No discharge.        Left eye: No discharge.     Extraocular Movements: Extraocular movements intact.     Conjunctiva/sclera: Conjunctivae normal.     Pupils: Pupils are equal, round, and reactive to light.  Neck:     Thyroid: No thyromegaly.     Vascular: No carotid bruit or JVD.     Trachea: No tracheal deviation.  Cardiovascular:     Rate and Rhythm: Normal rate and regular rhythm.     Pulses: Normal pulses.     Heart sounds: Normal heart sounds. No murmur. No friction rub. No gallop.   Pulmonary:     Effort: Pulmonary effort is normal. No respiratory distress.     Breath sounds: Normal breath sounds. No wheezing or rales.  Chest:     Chest wall: No  tenderness.     Breasts: Breasts are symmetrical.        Right: Normal. No inverted nipple, mass, nipple discharge, skin change or tenderness.        Left: Normal. No inverted nipple, mass, nipple discharge, skin change or tenderness.     Comments: implants Abdominal:     General: Abdomen is flat. Bowel sounds are normal. There is no distension.     Palpations: Abdomen is soft. There is no mass.     Tenderness: There is no abdominal tenderness. There is no guarding or rebound.     Hernia: There is no hernia in the left inguinal area.  Genitourinary:    Exam position: Supine.     Labia:        Right: No rash, tenderness, lesion or injury.        Left: No rash, tenderness, lesion or injury.      Vagina: Normal. No signs of injury. No vaginal discharge, erythema, tenderness or bleeding.     Cervix: No cervical motion tenderness, discharge or friability.     Adnexa:        Right: No mass, tenderness or fullness.         Left: No mass, tenderness or fullness.       Rectum: Normal.  Musculoskeletal:        General: No tenderness. Normal range of motion.     Cervical back: Normal range of motion and neck supple.     Right lower leg: No edema.     Left lower leg: No edema.  Lymphadenopathy:     Cervical: No cervical adenopathy.     Upper Body:     Right upper body: No supraclavicular, axillary or pectoral adenopathy.     Left upper body: No supraclavicular, axillary or pectoral adenopathy.  Skin:    General: Skin  is warm and dry.     Capillary Refill: Capillary refill takes less than 2 seconds.     Findings: No rash.  Neurological:     General: No focal deficit present.     Mental Status: She is alert and oriented to person, place, and time. Mental status is at baseline.     Cranial Nerves: No cranial nerve deficit.     Coordination: Coordination normal.     Deep Tendon Reflexes: Reflexes are normal and symmetric.  Psychiatric:        Mood and Affect: Mood normal.        Behavior:  Behavior normal.        Thought Content: Thought content normal.        Judgment: Judgment normal.      Depression Screen PHQ 2/9 Scores 10/15/2019 03/28/2018 10/13/2017  PHQ - 2 Score 2 4 2   PHQ- 9 Score 8 12 6        Assessment & Plan:     Routine Health Maintenance and Physical Exam  Exercise Activities and Dietary recommendations Goals   None     Immunization History  Administered Date(s) Administered  . Influenza,inj,Quad PF,6+ Mos 04/14/2015  . Tdap 11/05/2014    Health Maintenance  Topic Date Due  . PAP SMEAR-Modifier  11/05/2019  . INFLUENZA VACCINE  10/24/2019 (Originally 02/24/2019)  . HIV Screening  04/18/2020 (Originally 12/31/1990)  . TETANUS/TDAP  11/04/2024     Discussed health benefits of physical activity, and encouraged her to engage in regular exercise appropriate for her age and condition.    1. Annual physical exam Normal physical exam today. Will check labs as below and f/u pending lab results. If labs are stable and WNL she will not need to have these rechecked for one year at her next annual physical exam. She is to call the office in the meantime if she has any acute issue, questions or concerns. - CBC with Differential/Platelet - Lipid panel - TSH - Comprehensive Metabolic Panel (CMET)  2. Encounter for breast cancer screening using non-mammogram modality Breast exam today was normal. There is family history of breast cancer. She does perform regular self breast exams. Mammogram was ordered as below. Information for [facility] was given to patient so she may schedule her mammogram at her convenience. - MM 3D SCREEN BREAST W/IMPLANT BILATERAL; Future  3. Cervical cancer screening Pap collected today. Will send as below and f/u pending results. - Cytology - PAP  4. 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 Uses percocet 7.5-325mg . Also followed by Neurosurgery.   5. Thyroid  disease Will check labs as below and f/u pending results. - CBC with Differential/Platelet - TSH  6. Vitamin D deficiency Will check labs as below and f/u pending results. - CBC with Differential/Platelet -Vit D --------------------------------------------------------------------    01/04/2025, PA-C  [facility]

## 2019-10-15 ENCOUNTER — Encounter: Payer: Self-pay | Admitting: Physician Assistant

## 2019-10-15 ENCOUNTER — Ambulatory Visit (INDEPENDENT_AMBULATORY_CARE_PROVIDER_SITE_OTHER): Payer: BC Managed Care – PPO | Admitting: Physician Assistant

## 2019-10-15 ENCOUNTER — Other Ambulatory Visit: Payer: Self-pay

## 2019-10-15 ENCOUNTER — Other Ambulatory Visit: Payer: Self-pay | Admitting: Physician Assistant

## 2019-10-15 ENCOUNTER — Other Ambulatory Visit (HOSPITAL_COMMUNITY)
Admission: RE | Admit: 2019-10-15 | Discharge: 2019-10-15 | Disposition: A | Discharge status: Home / Self Care | Payer: BC Managed Care – PPO | Source: Ambulatory Visit | Attending: Physician Assistant | Admitting: Physician Assistant

## 2019-10-15 VITALS — BP 149/88 | HR 100 | Temp 96.9°F | Resp 16 | Ht 67.0 in | Wt 221.0 lb

## 2019-10-15 DIAGNOSIS — E559 Vitamin D deficiency, unspecified: Secondary | ICD-10-CM

## 2019-10-15 DIAGNOSIS — Z124 Encounter for screening for malignant neoplasm of cervix: Secondary | ICD-10-CM

## 2019-10-15 DIAGNOSIS — Z0000 Encounter for general adult medical examination without abnormal findings: Secondary | ICD-10-CM

## 2019-10-15 DIAGNOSIS — Z1239 Encounter for other screening for malignant neoplasm of breast: Secondary | ICD-10-CM

## 2019-10-15 DIAGNOSIS — E079 Disorder of thyroid, unspecified: Secondary | ICD-10-CM

## 2019-10-15 DIAGNOSIS — M4710 Other spondylosis with myelopathy, site unspecified: Secondary | ICD-10-CM | POA: Diagnosis not present

## 2019-10-15 NOTE — Patient Instructions (Signed)
[facility] [address] [place],  [place]  [address] Get Driving Directions Main: [phone]   Health Maintenance, Female Adopting a healthy lifestyle and getting preventive care are important in promoting health and wellness. Ask your health care provider about:  The right schedule for you to have regular tests and exams.  Things you can do on your own to prevent diseases and keep yourself healthy. What should I know about diet, weight, and exercise? Eat a healthy diet   Eat a diet that includes plenty of vegetables, fruits, low-fat dairy products, and lean protein.  Do not eat a lot of foods that are high in solid fats, added sugars, or sodium. Maintain a healthy weight Body mass index (BMI) is used to identify weight problems. It estimates body fat based on height and weight. Your health care provider can help determine your BMI and help you achieve or maintain a healthy weight. Get regular exercise Get regular exercise. This is one of the most important things you can do for your health. Most adults should:  Exercise for at least 150 minutes each week. The exercise should increase your heart rate and make you sweat (moderate-intensity exercise).  Do strengthening exercises at least twice a week. This is in addition to the moderate-intensity exercise.  Spend less time sitting. Even light physical activity can be beneficial. Watch cholesterol and blood lipids Have your blood tested for lipids and cholesterol at 44 years of age, then have this test every 5 years. Have your cholesterol levels checked more often if:  Your lipid or cholesterol levels are high.  You are older than 44 years of age.  You are at high risk for heart disease. What should I know about cancer screening? Depending on your health history and family history, you may need to have cancer screening at various ages. This may include screening for:  Breast  cancer.  Cervical cancer.  Colorectal cancer.  Skin cancer.  Lung cancer. What should I know about heart disease, diabetes, and high blood pressure? Blood pressure and heart disease  High blood pressure causes heart disease and increases the risk of stroke. This is more likely to develop in people who have high blood pressure readings, are of African descent, or are overweight.  Have your blood pressure checked: ? Every 3-5 years if you are 18-39 years of age. ? Every year if you are 40 years old or older. Diabetes Have regular diabetes screenings. This checks your fasting blood sugar level. Have the screening done:  Once every three years after age 40 if you are at a normal weight and have a low risk for diabetes.  More often and at a younger age if you are overweight or have a high risk for diabetes. What should I know about preventing infection? Hepatitis B If you have a higher risk for hepatitis B, you should be screened for this virus. Talk with your health care provider to find out if you are at risk for hepatitis B infection. Hepatitis C Testing is recommended for:  Everyone born from 1945 through 1965.  Anyone with known risk factors for hepatitis C. Sexually transmitted infections (STIs)  Get screened for STIs, including gonorrhea and chlamydia, if: ? You are sexually active and are younger than 44 years of age. ? You are older than 44 years of age and your health care provider tells you that you are at risk for this type of infection. ? Your sexual activity   has changed since you were last screened, and you are at increased risk for chlamydia or gonorrhea. Ask your health care provider if you are at risk.  Ask your health care provider about whether you are at high risk for HIV. Your health care provider may recommend a prescription medicine to help prevent HIV infection. If you choose to take medicine to prevent HIV, you should first get tested for HIV. You should  then be tested every 3 months for as long as you are taking the medicine. Pregnancy  If you are about to stop having your period (premenopausal) and you may become pregnant, seek counseling before you get pregnant.  Take 400 to 800 micrograms (mcg) of folic acid every day if you become pregnant.  Ask for birth control (contraception) if you want to prevent pregnancy. Osteoporosis and menopause Osteoporosis is a disease in which the bones lose minerals and strength with aging. This can result in bone fractures. If you are 65 years old or older, or if you are at risk for osteoporosis and fractures, ask your health care provider if you should:  Be screened for bone loss.  Take a calcium or vitamin D supplement to lower your risk of fractures.  Be given hormone replacement therapy (HRT) to treat symptoms of menopause. Follow these instructions at home: Lifestyle  Do not use any products that contain nicotine or tobacco, such as cigarettes, e-cigarettes, and chewing tobacco. If you need help quitting, ask your health care provider.  Do not use street drugs.  Do not share needles.  Ask your health care provider for help if you need support or information about quitting drugs. Alcohol use  Do not drink alcohol if: ? Your health care provider tells you not to drink. ? You are pregnant, may be pregnant, or are planning to become pregnant.  If you drink alcohol: ? Limit how much you use to 0-1 drink a day. ? Limit intake if you are breastfeeding.  Be aware of how much alcohol is in your drink. In the [place] one drink equals one 12 oz bottle of beer (355 mL), one 5 oz glass of wine (148 mL), or one 1 oz glass of hard liquor (44 mL). General instructions  Schedule regular health, dental, and eye exams.  Stay current with your vaccines.  Tell your health care provider if: ? You often feel depressed. ? You have ever been abused or do not feel safe at home. Summary  Adopting a  healthy lifestyle and getting preventive care are important in promoting health and wellness.  Follow your health care provider's instructions about healthy diet, exercising, and getting tested or screened for diseases.  Follow your health care provider's instructions on monitoring your cholesterol and blood pressure. This information is not intended to replace advice given to you by your health care provider. Make sure you discuss any questions you have with your health care provider. Document Revised: 07/05/2018 Document Reviewed: 07/05/2018 Elsevier Patient Education  2020 Elsevier Inc[employer]  

## 2019-10-16 LAB — CBC WITH DIFFERENTIAL/PLATELET
Basophils Absolute: 0.1 10*3/uL (ref 0.0–0.2)
Basos: 1 %
EOS (ABSOLUTE): 0.2 10*3/uL (ref 0.0–0.4)
Eos: 2 %
Hematocrit: 36.7 % (ref 34.0–46.6)
Hemoglobin: 12.5 g/dL (ref 11.1–15.9)
Immature Grans (Abs): 0 10*3/uL (ref 0.0–0.1)
Immature Granulocytes: 0 %
Lymphocytes Absolute: 2.4 10*3/uL (ref 0.7–3.1)
Lymphs: 29 %
MCH: 32.2 pg (ref 26.6–33.0)
MCHC: 34.1 g/dL (ref 31.5–35.7)
MCV: 95 fL (ref 79–97)
Monocytes Absolute: 0.5 10*3/uL (ref 0.1–0.9)
Monocytes: 6 %
Neutrophils Absolute: 5.3 10*3/uL (ref 1.4–7.0)
Neutrophils: 62 %
Platelets: 337 10*3/uL (ref 150–450)
RBC: 3.88 x10E6/uL (ref 3.77–5.28)
RDW: 12 % (ref 11.7–15.4)
WBC: 8.3 10*3/uL (ref 3.4–10.8)

## 2019-10-16 LAB — COMPREHENSIVE METABOLIC PANEL
ALT: 29 IU/L (ref 0–32)
AST: 28 IU/L (ref 0–40)
Albumin/Globulin Ratio: 1.8 (ref 1.2–2.2)
Albumin: 4.2 g/dL (ref 3.8–4.8)
Alkaline Phosphatase: 93 IU/L (ref 39–117)
BUN/Creatinine Ratio: 15 (ref 9–23)
BUN: 10 mg/dL (ref 6–24)
Bilirubin Total: <0.2 mg/dL (ref 0.0–1.2)
CO2: 21 mmol/L (ref 20–29)
Calcium: 9.1 mg/dL (ref 8.7–10.2)
Chloride: 100 mmol/L (ref 96–106)
Creatinine, Ser: 0.66 mg/dL (ref 0.57–1.00)
GFR calc Af Amer: 125 mL/min/{1.73_m2} (ref >59)
GFR calc non Af Amer: 109 mL/min/{1.73_m2} (ref >59)
Globulin, Total: 2.4 g/dL (ref 1.5–4.5)
Glucose: 96 mg/dL (ref 65–99)
Potassium: 4.3 mmol/L (ref 3.5–5.2)
Sodium: 135 mmol/L (ref 134–144)
Total Protein: 6.6 g/dL (ref 6.0–8.5)

## 2019-10-16 LAB — LIPID PANEL
Chol/HDL Ratio: 4.7 ratio — ABNORMAL HIGH (ref 0.0–4.4)
Cholesterol, Total: 255 mg/dL — ABNORMAL HIGH (ref 100–199)
HDL: 54 mg/dL (ref >39)
LDL Chol Calc (NIH): 163 mg/dL — ABNORMAL HIGH (ref 0–99)
Triglycerides: 209 mg/dL — ABNORMAL HIGH (ref 0–149)
VLDL Cholesterol Cal: 38 mg/dL (ref 5–40)

## 2019-10-16 LAB — VITAMIN D 25 HYDROXY (VIT D DEFICIENCY, FRACTURES): Vit D, 25-Hydroxy: 21.9 ng/mL — ABNORMAL LOW (ref 30.0–100.0)

## 2019-10-16 LAB — TSH: TSH: 2.94 u[IU]/mL (ref 0.450–4.500)

## 2019-10-17 ENCOUNTER — Telehealth: Payer: Self-pay

## 2019-10-17 LAB — CYTOLOGY - PAP
Comment: NEGATIVE
Diagnosis: NEGATIVE
High risk HPV: NEGATIVE

## 2019-10-17 NOTE — Telephone Encounter (Signed)
[employer]   Result Notes and Comments to Patient Comment seen by patient Cena Benton on 10/17/2019 11:14 AM EDT

## 2019-10-17 NOTE — Telephone Encounter (Signed)
-----   Message from Margaretann Loveless, [place] sent at 10/17/2019 10:11 AM EDT ----- Blood count is normal. Kidney and liver function are normal. Sodium, potassium and calcium is normal. Cholesterol is elevated. Currently your risk of having a cardiovascular event over the next 10 years is low at 1.5%. Work on healthy lifestyle habits with limiting fatty foods, processed meats, red meats and sugars. Increasing physical activity as tolerated to try to get in 150 min of moderate activity will help also. Thyroid is normal. Vit D is still low, but improved from last year. Continue Vit D supplementation once weekly.

## 2019-10-19 ENCOUNTER — Other Ambulatory Visit: Payer: Self-pay | Admitting: Physician Assistant

## 2019-10-19 DIAGNOSIS — J4541 Moderate persistent asthma with (acute) exacerbation: Secondary | ICD-10-CM

## 2019-10-30 ENCOUNTER — Encounter: Payer: Self-pay | Admitting: Physician Assistant

## 2019-10-30 ENCOUNTER — Other Ambulatory Visit: Payer: Self-pay | Admitting: Physician Assistant

## 2019-10-30 DIAGNOSIS — M4710 Other spondylosis with myelopathy, site unspecified: Secondary | ICD-10-CM

## 2019-10-31 MED ORDER — OXYCODONE-ACETAMINOPHEN 7.5-325 MG PO TABS: 1.0000 | ORAL_TABLET | ORAL | 0 refills | Status: DC | PRN

## 2019-11-14 ENCOUNTER — Encounter: Payer: Self-pay | Admitting: Physician Assistant

## 2019-11-23 ENCOUNTER — Encounter: Payer: Self-pay | Admitting: Physician Assistant

## 2019-11-23 DIAGNOSIS — F410 Panic disorder [episodic paroxysmal anxiety] without agoraphobia: Secondary | ICD-10-CM

## 2019-11-23 DIAGNOSIS — F418 Other specified anxiety disorders: Secondary | ICD-10-CM

## 2019-11-23 DIAGNOSIS — M4710 Other spondylosis with myelopathy, site unspecified: Secondary | ICD-10-CM

## 2019-11-23 MED ORDER — CLONAZEPAM 0.5 MG PO TABS: 0.5000 mg | ORAL_TABLET | Freq: Every day | ORAL | 1 refills | Status: DC | PRN

## 2019-11-23 MED ORDER — OXYCODONE-ACETAMINOPHEN 7.5-325 MG PO TABS: 1.0000 | ORAL_TABLET | ORAL | 0 refills | Status: DC | PRN

## 2019-11-23 NOTE — Addendum Note (Signed)
Addended by: Margaretann Loveless on: 11/23/2019 11:38 AM   Modules accepted: Orders

## 2019-11-29 MED ORDER — HYDROXYZINE HCL 50 MG PO TABS: 50.0000 mg | ORAL_TABLET | Freq: Three times a day (TID) | ORAL | 5 refills | Status: DC | PRN

## 2019-11-29 NOTE — Addendum Note (Signed)
Addended by: Margaretann Loveless on: 11/29/2019 07:13 PM   Modules accepted: Orders

## 2019-12-18 ENCOUNTER — Other Ambulatory Visit: Payer: Self-pay | Admitting: Physician Assistant

## 2019-12-18 ENCOUNTER — Encounter: Payer: Self-pay | Admitting: Physician Assistant

## 2019-12-18 DIAGNOSIS — K219 Gastro-esophageal reflux disease without esophagitis: Secondary | ICD-10-CM

## 2019-12-18 DIAGNOSIS — M4710 Other spondylosis with myelopathy, site unspecified: Secondary | ICD-10-CM

## 2019-12-18 NOTE — Telephone Encounter (Signed)
Requested Prescriptions  Pending Prescriptions Disp Refills  . omeprazole (PRILOSEC) 20 MG capsule [Pharmacy Med Name: OMEPRAZOLE DR 20 MG CAPSULE] 90 capsule 3    Sig: TAKE 1 CAPSULE BY MOUTH EVERY DAY     Gastroenterology: Proton Pump Inhibitors Passed - 12/18/2019 11:02 PM      Passed - Valid encounter within last 12 months    Recent Outpatient Visits          2 months ago Annual physical exam   [facility] Joycelyn Man M, [place]   8 months ago Injury of left ankle, initial encounter   [facility] Joycelyn Man M, [place]   10 months ago Gastroesophageal reflux disease without esophagitis   [facility], Alessandra Bevels, [place]   1 year ago Bronchitis   [facility], Alessandra Bevels, [place]   1 year ago Influenza A   [facility] [place], [place], [place]

## 2019-12-19 MED ORDER — OXYCODONE-ACETAMINOPHEN 7.5-325 MG PO TABS: 1.0000 | ORAL_TABLET | ORAL | 0 refills | Status: DC | PRN

## 2020-01-09 NOTE — Progress Notes (Signed)
Established patient visit   Patient: Casey Carter   DOB: 10/30/1975   44 y.o. Female  MRN: [id number] Visit Date: 01/10/2020  Today's healthcare provider: Mar Daring, PA-C   Chief Complaint  Patient presents with  . Follow-up   Subjective    HPI Patient here with c/o back pain, lower. This is a chronic problem. She wants to talk to the provider about the Percocet medication. Not working as well. Also now that she is done with her [place] she is ready to proceed back with the Neurosurgeon, Dr. Durward Fortes, for surgical considerations.   GERD: reports that the omeprazole is not working. Reports that in the middle of the night she is waking with the acid reflux and burning in her throat. Reports that she had this episode last night, that she had to have a cup of milk. She has been taking TUMS.  Left arm pain: reports that since she had her first covid vaccine (05/18) her arm has been hurting. It hurts to lay on it and different movement.   Patient Active Problem List   Diagnosis Date Noted  . Thyroid disease   . 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 Vitamin D deficiency 11/10/2017  . GERD (gastroesophageal reflux disease) 11/04/2017  . 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 Acute depression 04/14/2015  . Panic 04/14/2015   Past Medical History:  Diagnosis Date  . Allergy   . Anxiety   . Depression   . Endometriosis   . 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 Thyroid disease        Medications: Outpatient Medications Prior to Visit  Medication Sig  . buPROPion (WELLBUTRIN XL) 150 MG 24 hr tablet TAKE 1 TABLET(150 MG) BY MOUTH DAILY  . escitalopram (LEXAPRO) 20 MG tablet TAKE 1 TABLET(20 MG) BY MOUTH DAILY  . hydrOXYzine (ATARAX/VISTARIL) 50 MG tablet Take 1 tablet (50 mg total) by mouth 3 (three) times daily as needed.  . [DISCONTINUED] montelukast (SINGULAIR) 10 MG tablet  Take 1 tablet (10 mg total) by mouth at bedtime.  . [DISCONTINUED] omeprazole (PRILOSEC) 20 MG capsule TAKE 1 CAPSULE BY MOUTH EVERY DAY  . [DISCONTINUED] oxyCODONE-acetaminophen (PERCOCET) 7.5-325 MG tablet Take 1 tablet by mouth every 4 (four) hours as needed for severe pain.  Marland Kitchen albuterol (PROVENTIL HFA;VENTOLIN HFA) 108 (90 Base) MCG/ACT inhaler Inhale 2 puffs into the lungs every 6 (six) hours as needed for wheezing or shortness of breath. (Patient not taking: Reported on 02/09/2019)  . FLOVENT HFA 110 MCG/ACT inhaler INHALE 2 PUFFS BY MOUTH INTO THE LUNGS DAILY  . phentermine (ADIPEX-P) 37.5 MG tablet Take 1 tablet (37.5 mg total) by mouth daily before breakfast. (Patient not taking: Reported on 01/10/2020)  . Vitamin D, Ergocalciferol, (DRISDOL) 1.25 MG (50000 UT) CAPS capsule TAKE 1 CAPSULE BY MOUTH EVERY 7 DAYS (Patient not taking: Reported on 01/10/2020)  . [DISCONTINUED] meloxicam (MOBIC) 7.5 MG tablet TK 1 T PO BID (Patient not taking: Reported on 01/10/2020)  . [DISCONTINUED] nitrofurantoin, macrocrystal-monohydrate, (MACROBID) 100 MG capsule Take 1 capsule (100 mg total) by mouth 2 (two) times daily.   No facility-administered medications prior to visit.    Review of Systems  Constitutional: Negative.   Respiratory: Negative.   Cardiovascular: Negative.   Musculoskeletal: Positive for arthralgias, back pain, gait problem and myalgias.  Neurological: Positive for numbness. Negative for weakness.    Last CBC Lab Results  Component Value Date   WBC 8.3 10/15/2019   HGB 12.5 10/15/2019   HCT 36.7 10/15/2019   MCV 95 10/15/2019   MCH 32.2 10/15/2019   RDW 12.0 10/15/2019   PLT 337 10/15/2019   Last metabolic panel Lab Results  Component Value Date   GLUCOSE 96 10/15/2019   NA 135 10/15/2019   K 4.3 10/15/2019   CL 100 10/15/2019   CO2 21 10/15/2019   BUN 10 10/15/2019   CREATININE 0.66 10/15/2019   GFRNONAA 109 10/15/2019   GFRAA 125 10/15/2019   CALCIUM 9.1 10/15/2019    PROT 6.6 10/15/2019   ALBUMIN 4.2 10/15/2019   LABGLOB 2.4 10/15/2019   AGRATIO 1.8 10/15/2019   BILITOT <0.2 10/15/2019   ALKPHOS 93 10/15/2019   AST 28 10/15/2019   ALT 29 10/15/2019      Objective    BP (!) 154/95 (BP Location: Right Arm, Patient Position: Sitting, Cuff Size: Large)   Pulse 91   Temp (!) 97 F (36.1 C) (Temporal)   Resp 16   Wt 225 lb 12.8 oz (102.4 kg)   BMI 35.37 kg/m  BP Readings from Last 3 Encounters:  01/10/20 (!) 154/95  10/15/19 (!) 149/88  04/19/19 139/80   Wt Readings from Last 3 Encounters:  01/10/20 225 lb 12.8 oz (102.4 kg)  10/15/19 221 lb (100.2 kg)  02/09/19 222 lb (100.7 kg)      Physical Exam Vitals reviewed.  Constitutional:      General: She is not in acute distress.    Appearance: Normal appearance. She is well-developed. She is obese. She is not ill-appearing.  HENT:     Head: Normocephalic and atraumatic.  Pulmonary:     Effort: Pulmonary effort is normal. No respiratory distress.  Musculoskeletal:     Cervical back: Normal range of motion and neck supple.  Neurological:     Mental Status: She is alert.  Psychiatric:        Mood and Affect: Mood normal.        Behavior: Behavior normal.        Thought Content: Thought content normal.        Judgment: Judgment normal.      No results found for any visits on 01/10/20.  Assessment & Plan     1. 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 Cleophas Dunker. Pain medication was changed to hydrocodone-ibu, however, pharmacies could not provide. So, medication was changed to Hydrocodone-APAP 10-325mg .  - Ambulatory referral to Neurosurgery  2. Gastroesophageal reflux disease without esophagitis Has failed omeprazole 20mg  and 40mg  and protonix 40mg . Will send in Dexilant as below. Most likely component of NSAID induced gastritis from IBU use. Call if not improving.  - dexlansoprazole (DEXILANT) 60 MG capsule; Take 1  capsule (60 mg total) by mouth daily.  Dispense: 90 capsule; Refill: 1  3. Left arm pain Most likely from covid 19 vaccine. Advised to continue moving arm. Exercises and stretches provided. Call if not better in 6-8 weeks from vaccination.   Return if symptoms worsen or fail to improve.      , PA-C, have reviewed all documentation for this visit. The documentation on 01/13/20 for the exam, diagnosis, procedures, and orders are all accurate and complete.    [facility] [phone] (phone) [phone] (fax)  [facility]

## 2020-01-10 ENCOUNTER — Other Ambulatory Visit: Payer: Self-pay

## 2020-01-10 ENCOUNTER — Encounter: Payer: Self-pay | Admitting: Physician Assistant

## 2020-01-10 ENCOUNTER — Ambulatory Visit: Payer: BC Managed Care – PPO | Admitting: Physician Assistant

## 2020-01-10 VITALS — BP 154/95 | HR 91 | Temp 97.0°F | Resp 16 | Wt 225.8 lb

## 2020-01-10 DIAGNOSIS — M4710 Other spondylosis with myelopathy, site unspecified: Secondary | ICD-10-CM

## 2020-01-10 DIAGNOSIS — K219 Gastro-esophageal reflux disease without esophagitis: Secondary | ICD-10-CM

## 2020-01-10 DIAGNOSIS — M79602 Pain in left arm: Secondary | ICD-10-CM | POA: Diagnosis not present

## 2020-01-10 MED ORDER — HYDROCODONE-IBUPROFEN 10-200 MG PO TABS: 1.0000 | ORAL_TABLET | ORAL | 0 refills | Status: DC | PRN

## 2020-01-10 MED ORDER — DEXLANSOPRAZOLE 60 MG PO CPDR: 60.0000 mg | DELAYED_RELEASE_CAPSULE | Freq: Every day | ORAL | 1 refills | Status: DC

## 2020-01-10 NOTE — Patient Instructions (Addendum)
Dexlansoprazole capsules What is this medicine? DEXLANSOPRAZOLE (dex lan SOE pra zole) prevents the production of acid in the stomach. It is used to treat gastroesophageal reflux disease (GERD) and inflammation of the esophagus. This medicine may be used for other purposes; ask your health care provider or pharmacist if you have questions. COMMON BRAND NAME(S): Dexilant, Kapidex What should I tell my health care provider before I take this medicine? They need to know if you have any of these conditions:  liver disease  low levels of magnesium in the blood  lupus  an unusual or allergic reaction to dexlansoprazole, other medicines, foods, dyes, or preservatives  pregnant or trying to get pregnant  breast-feeding How should I use this medicine? Take this medicine by mouth. Swallow the capsules whole with a drink of water. Follow the directions on the prescription label. Do not crush or chew. Take your medicine at regular intervals. Do not take more often than directed. If you have difficulty swallowing the capsules, you may open the capsule and sprinkle the contents on a tablespoon of applesauce. Do not crush the contents of the capsule into the food. Swallow the dose immediately after preparing it. Do not chew. Follow with a drink of water. A special MedGuide will be given to you before each treatment. Be sure to read this information carefully each time. Talk to your pediatrician regarding the use of this medicine in children. While this drug may be prescribed for children as young as 12 years for selected conditions, precautions do apply. Overdosage: If you think you have taken too much of this medicine contact a poison control center or emergency room at once. NOTE: This medicine is only for you. Do not share this medicine with others. What if I miss a dose? If you miss a dose, take it as soon as you can. If it is almost time for your next dose, take only that dose. Do not take double or  extra doses. What may interact with this medicine? Do not take this medicine with any of the following medications:  nelfinavir  rilpivirine  St. John's Wort This medicine may also interact with the following medications:  certain antiviral medicines for HIV or AIDS like atazanavir, saquinavir, ritonavir  certain medicines for fungal infections like ketoconazole, itraconazole, voriconazole  dasatinib  digoxin  erlotinib  iron salts  methotrexate  mycophenolate mofetil  nilotinib  tacrolimus  warfarin This list may not describe all possible interactions. Give your health care provider a list of all the medicines, herbs, non-prescription drugs, or dietary supplements you use. Also tell them if you smoke, drink alcohol, or use illegal drugs. Some items may interact with your medicine. What should I watch for while using this medicine? It can take several days before your stomach pain gets better. Check with your doctor or health care professional if your condition does not start to get better, or if it gets worse. You may need blood work done while you are taking this medicine. This medicine may cause a decrease in vitamin B12. You should make sure that you get enough vitamin B12 while you are taking this medicine. Discuss the foods you eat and the vitamins you take with your health care professional. What side effects may I notice from receiving this medicine? Side effects that you should report to your doctor or health care professional as soon as possible:   allergic reactions like skin rash, itching or hives, swelling of the face, lips, or tongue   bone,  muscle or joint pain   breathing problems   chest pain or chest tightness   dark yellow or brown urine   dizziness   fast, irregular heartbeat   feeling faint or lightheaded   fever or sore throat   muscle spasm   palpitations   rash on cheeks or arms that gets worse in the sun   redness,  blistering, peeling or loosening of the skin, including inside the mouth   seizures  stomach polyps   tremors   unusual bleeding or bruising   unusually weak or tired   yellowing of the eyes or skin Side effects that usually do not require medical attention (report to your doctor or health care professional if they continue or are bothersome):   constipation   diarrhea   dry mouth   headache   nausea This list may not describe all possible side effects. Call your doctor for medical advice about side effects. You may report side effects to FDA at 1-800-FDA-1088. Where should I keep my medicine? Keep out of the reach of children. Store at room temperature between 15 and 30 degrees C (59 and 86 degrees F). Protect from moisture. Throw away any unused medicine after the expiration date. NOTE: This sheet is a summary. It may not cover all possible information. If you have questions about this medicine, talk to your doctor, pharmacist, or health care provider.  2020 Elsevier/Gold Standard (2017-03-17 17:36:31)   Shoulder Exercises Ask your health care provider which exercises are safe for you. Do exercises exactly as told by your health care provider and adjust them as directed. It is normal to feel mild stretching, pulling, tightness, or discomfort as you do these exercises. Stop right away if you feel sudden pain or your pain gets worse. Do not begin these exercises until told by your health care provider. Stretching exercises External rotation and abduction This exercise is sometimes called corner stretch. This exercise rotates your arm outward (external rotation) and moves your arm out from your body (abduction). 1. Stand in a doorway with one of your feet slightly in front of the other. This is called a staggered stance. If you cannot reach your forearms to the door frame, stand facing a corner of a room. 2. Choose one of the following positions as told by your health  care provider: ? Place your hands and forearms on the door frame above your head. ? Place your hands and forearms on the door frame at the height of your head. ? Place your hands on the door frame at the height of your elbows. 3. Slowly move your weight onto your front foot until you feel a stretch across your chest and in the front of your shoulders. Keep your head and chest upright and keep your abdominal muscles tight. 4. Hold for __________ seconds. 5. To release the stretch, shift your weight to your back foot. Repeat __________ times. Complete this exercise __________ times a day. Extension, standing 1. Stand and hold a broomstick, a cane, or a similar object behind your back. ? Your hands should be a little wider than shoulder width apart. ? Your palms should face away from your back. 2. Keeping your elbows straight and your shoulder muscles relaxed, move the stick away from your body until you feel a stretch in your shoulders (extension). ? Avoid shrugging your shoulders while you move the stick. Keep your shoulder blades tucked down toward the middle of your back. 3. Hold for __________ seconds. 4.  Slowly return to the starting position. Repeat __________ times. Complete this exercise __________ times a day. Range-of-motion exercises Pendulum  1. Stand near a wall or a surface that you can hold onto for balance. 2. Bend at the waist and let your left / right arm hang straight down. Use your other arm to support you. Keep your back straight and do not lock your knees. 3. Relax your left / right arm and shoulder muscles, and move your hips and your trunk so your left / right arm swings freely. Your arm should swing because of the motion of your body, not because you are using your arm or shoulder muscles. 4. Keep moving your hips and trunk so your arm swings in the following directions, as told by your health care provider: ? Side to side. ? Forward and backward. ? In clockwise and  counterclockwise circles. 5. Continue each motion for __________ seconds, or for as long as told by your health care provider. 6. Slowly return to the starting position. Repeat __________ times. Complete this exercise __________ times a day. Shoulder flexion, standing  1. Stand and hold a broomstick, a cane, or a similar object. Place your hands a little more than shoulder width apart on the object. Your left / right hand should be palm up, and your other hand should be palm down. 2. Keep your elbow straight and your shoulder muscles relaxed. Push the stick up with your healthy arm to raise your left / right arm in front of your body, and then over your head until you feel a stretch in your shoulder (flexion). ? Avoid shrugging your shoulder while you raise your arm. Keep your shoulder blade tucked down toward the middle of your back. 3. Hold for __________ seconds. 4. Slowly return to the starting position. Repeat __________ times. Complete this exercise __________ times a day. Shoulder abduction, standing 1. Stand and hold a broomstick, a cane, or a similar object. Place your hands a little more than shoulder width apart on the object. Your left / right hand should be palm up, and your other hand should be palm down. 2. Keep your elbow straight and your shoulder muscles relaxed. Push the object across your body toward your left / right side. Raise your left / right arm to the side of your body (abduction) until you feel a stretch in your shoulder. ? Do not raise your arm above shoulder height unless your health care provider tells you to do that. ? If directed, raise your arm over your head. ? Avoid shrugging your shoulder while you raise your arm. Keep your shoulder blade tucked down toward the middle of your back. 3. Hold for __________ seconds. 4. Slowly return to the starting position. Repeat __________ times. Complete this exercise __________ times a day. Internal rotation  1. Place  your left / right hand behind your back, palm up. 2. Use your other hand to dangle an exercise band, a towel, or a similar object over your shoulder. Grasp the band with your left / right hand so you are holding on to both ends. 3. Gently pull up on the band until you feel a stretch in the front of your left / right shoulder. The movement of your arm toward the center of your body is called internal rotation. ? Avoid shrugging your shoulder while you raise your arm. Keep your shoulder blade tucked down toward the middle of your back. 4. Hold for __________ seconds. 5. Release the stretch by letting  go of the band and lowering your hands. Repeat __________ times. Complete this exercise __________ times a day. Strengthening exercises External rotation  1. Sit in a stable chair without armrests. 2. Secure an exercise band to a stable object at elbow height on your left / right side. 3. Place a soft object, such as a folded towel or a small pillow, between your left / right upper arm and your body to move your elbow about 4 inches (10 cm) away from your side. 4. Hold the end of the exercise band so it is tight and there is no slack. 5. Keeping your elbow pressed against the soft object, slowly move your forearm out, away from your abdomen (external rotation). Keep your body steady so only your forearm moves. 6. Hold for __________ seconds. 7. Slowly return to the starting position. Repeat __________ times. Complete this exercise __________ times a day. Shoulder abduction  1. Sit in a stable chair without armrests, or stand up. 2. Hold a __________ weight in your left / right hand, or hold an exercise band with both hands. 3. Start with your arms straight down and your left / right palm facing in, toward your body. 4. Slowly lift your left / right hand out to your side (abduction). Do not lift your hand above shoulder height unless your health care provider tells you that this is safe. ? Keep your  arms straight. ? Avoid shrugging your shoulder while you do this movement. Keep your shoulder blade tucked down toward the middle of your back. 5. Hold for __________ seconds. 6. Slowly lower your arm, and return to the starting position. Repeat __________ times. Complete this exercise __________ times a day. Shoulder extension 1. Sit in a stable chair without armrests, or stand up. 2. Secure an exercise band to a stable object in front of you so it is at shoulder height. 3. Hold one end of the exercise band in each hand. Your palms should face each other. 4. Straighten your elbows and lift your hands up to shoulder height. 5. Step back, away from the secured end of the exercise band, until the band is tight and there is no slack. 6. Squeeze your shoulder blades together as you pull your hands down to the sides of your thighs (extension). Stop when your hands are straight down by your sides. Do not let your hands go behind your body. 7. Hold for __________ seconds. 8. Slowly return to the starting position. Repeat __________ times. Complete this exercise __________ times a day. Shoulder row 1. Sit in a stable chair without armrests, or stand up. 2. Secure an exercise band to a stable object in front of you so it is at waist height. 3. Hold one end of the exercise band in each hand. Position your palms so that your thumbs are facing the ceiling (neutral position). 4. Bend each of your elbows to a 90-degree angle (right angle) and keep your upper arms at your sides. 5. Step back until the band is tight and there is no slack. 6. Slowly pull your elbows back behind you. 7. Hold for __________ seconds. 8. Slowly return to the starting position. Repeat __________ times. Complete this exercise __________ times a day. Shoulder press-ups  1. Sit in a stable chair that has armrests. Sit upright, with your feet flat on the floor. 2. Put your hands on the armrests so your elbows are bent and your  fingers are pointing forward. Your hands should be about even with  the sides of your body. 3. Push down on the armrests and use your arms to lift yourself off the chair. Straighten your elbows and lift yourself up as much as you comfortably can. ? Move your shoulder blades down, and avoid letting your shoulders move up toward your ears. ? Keep your feet on the ground. As you get stronger, your feet should support less of your body weight as you lift yourself up. 4. Hold for __________ seconds. 5. Slowly lower yourself back into the chair. Repeat __________ times. Complete this exercise __________ times a day. Wall push-ups  1. Stand so you are facing a stable wall. Your feet should be about one arm-length away from the wall. 2. Lean forward and place your palms on the wall at shoulder height. 3. Keep your feet flat on the floor as you bend your elbows and lean forward toward the wall. 4. Hold for __________ seconds. 5. Straighten your elbows to push yourself back to the starting position. Repeat __________ times. Complete this exercise __________ times a day. This information is not intended to replace advice given to you by your health care provider. Make sure you discuss any questions you have with your health care provider. Document Revised: 11/03/2018 Document Reviewed: 08/11/2018 Elsevier Patient Education  2020 [employer].

## 2020-01-11 ENCOUNTER — Other Ambulatory Visit: Payer: Self-pay | Admitting: Physician Assistant

## 2020-01-11 ENCOUNTER — Encounter: Payer: Self-pay | Admitting: Physician Assistant

## 2020-01-11 ENCOUNTER — Telehealth: Payer: Self-pay

## 2020-01-11 ENCOUNTER — Telehealth: Payer: Self-pay | Admitting: *Deleted

## 2020-01-11 DIAGNOSIS — J3081 Allergic rhinitis due to animal (cat) (dog) hair and dander: Secondary | ICD-10-CM

## 2020-01-11 DIAGNOSIS — M4710 Other spondylosis with myelopathy, site unspecified: Secondary | ICD-10-CM

## 2020-01-11 MED ORDER — HYDROCODONE-ACETAMINOPHEN 10-325 MG PO TABS: 1.0000 | ORAL_TABLET | ORAL | 0 refills | Status: AC | PRN

## 2020-01-11 NOTE — Telephone Encounter (Signed)
This has been addressed.

## 2020-01-11 NOTE — Telephone Encounter (Signed)
Patient would like a callback once medication has been sent over to pharmacy this morning

## 2020-01-11 NOTE — Telephone Encounter (Signed)
Patient is stating that the pharmacy needs a PA from PCP or nurse.  Patient is requesting when PA is done we call patient to let her know Call back [phone]

## 2020-01-11 NOTE — Telephone Encounter (Signed)
Hydrocodone-Ibuprofen not available anywhere.  Changed to Hydrocodone-acetaminophen 10-325mg  to [facility]

## 2020-01-11 NOTE — Telephone Encounter (Signed)
Patient is calling again to check the status of her medication request.  She stated the pharmacy still does not have the medication and patient only has 2 pills left.  Please advise.

## 2020-01-11 NOTE — Telephone Encounter (Signed)
Copied from CRM [id number]. Topic: General - Inquiry >> Jan 11, 2020  9:58 AM Daphine Deutscher D wrote: Reason for CRM: Pt called saying she was  in yesterday to see Antony Contras and she changed her pain med to hydrocodone with ibuprofen and [facility].  Patient ask if Antony Contras could send the script to [facility] in [place].  CB#  [phone]

## 2020-01-11 NOTE — Telephone Encounter (Signed)
Patient called office again stating [facility] that the new rx Hydrocodone-acetaminophen 10-325mg , that was sent to pharmacy today will need a PA.

## 2020-01-11 NOTE — Telephone Encounter (Signed)
Copied from CRM [id number]. Topic: General - Inquiry >> Jan 11, 2020  9:58 AM Daphine Deutscher D wrote: Reason for CRM: Pt called saying she was  in yesterday to see Antony Contras and she changed her pain med to hydrocodone with ibuprofen and [facility].  Patient ask if Antony Contras could send the script to [facility] in [place].  CB#  [phone] Please advise?

## 2020-01-24 ENCOUNTER — Other Ambulatory Visit: Payer: Self-pay | Admitting: Physician Assistant

## 2020-01-24 DIAGNOSIS — F32A Depression, unspecified: Secondary | ICD-10-CM

## 2020-01-30 ENCOUNTER — Ambulatory Visit (INDEPENDENT_AMBULATORY_CARE_PROVIDER_SITE_OTHER): Payer: BC Managed Care – PPO | Admitting: Orthopaedic Surgery

## 2020-01-30 ENCOUNTER — Encounter: Payer: Self-pay | Admitting: Orthopaedic Surgery

## 2020-01-30 ENCOUNTER — Other Ambulatory Visit: Payer: Self-pay

## 2020-01-30 DIAGNOSIS — G8929 Other chronic pain: Secondary | ICD-10-CM

## 2020-01-30 DIAGNOSIS — M545 Low back pain, unspecified: Secondary | ICD-10-CM | POA: Insufficient documentation

## 2020-01-30 NOTE — Progress Notes (Signed)
Office Visit Note   Patient: Casey Carter           Date of Birth: 1975-07-31           MRN: [id number] Visit Date: 01/30/2020              Requested by: Margaretann Loveless, PA-C [address] [place],  [place] [address] PCP: Margaretann Loveless, PA-C   Assessment & Plan: Visit Diagnoses:  1. Chronic bilateral low back pain without sciatica     Plan: Mrs. Hillmer came to the office today to specifically talk about having back surgery.  She was mistakenly referred to this office as she had seen Dr. Dutch Quint at [facility] neurosurgery.  We have called their office and confirmed that she was a patient and she will make an appointment to go back and see him.  I had seen her initially about a year and a half ago for her back but not since.  No charge for her visit.  Examination was not performed  Follow-Up Instructions: Return if symptoms worsen or fail to improve.   Orders:  No orders of the defined types were placed in this encounter.  No orders of the defined types were placed in this encounter.     Procedures: No procedures performed   Clinical Data: No additional findings.   Subjective: Chief Complaint  Patient presents with  . Lower Back - Pain  Patient presents today for lower back pain. She was last here in October of 2019 and states that Dr.Lilyonna Steidle talked to her about surgery. She is back today to discuss moving forward with surgery. She takes hydrocodone for pain. Her pain radiates down both legs, along with numbness and tingling sometimes.   HPI  Review of Systems   Objective: Vital Signs: Ht 5\' 7"  (1.702 m)   BMI 35.37 kg/m   Physical Exam  Ortho Exam  Specialty Comments:  No specialty comments available.  Imaging: No results found.   PMFS History: Patient Active Problem List   Diagnosis Date Noted  . Low back pain 01/30/2020  . Thyroid disease   . 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 Vitamin D deficiency 11/10/2017  . GERD (gastroesophageal reflux disease) 11/04/2017  . 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 Acute depression 04/14/2015  . Panic 04/14/2015   Past Medical History:  Diagnosis Date  . Allergy   . Anxiety   . Depression   . Endometriosis   . 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 Thyroid disease     Family History  Problem Relation Age of Onset  . Cancer Mother   . Thyroid disease Mother   . Depression Mother   . Cancer Maternal Grandmother   . Diabetes Maternal Grandmother   . Diabetes Paternal Grandmother   . Cancer Paternal Grandmother   . Stroke Paternal Grandfather   . Cancer Paternal Grandfather   . Anxiety disorder Other   . Thyroid disease Other   . Depression Other   . Cancer Other        Thyroid,ovarian,breast,uterine,stomach,esophagus,ovarian    History reviewed. No pertinent surgical history. Social History   Occupational History  . Not on file  Tobacco Use  . Smoking status: Never Smoker  . Smokeless tobacco: Never Used  Vaping Use  . Vaping Use: Never used  Substance and Sexual Activity  . Alcohol use: Yes    Alcohol/week:  7.0 standard drinks    Types: 7 Glasses of wine per week  . Drug use: Never  . Sexual activity: Not on file

## 2020-02-04 ENCOUNTER — Encounter: Payer: Self-pay | Admitting: Physician Assistant

## 2020-02-04 DIAGNOSIS — M4710 Other spondylosis with myelopathy, site unspecified: Secondary | ICD-10-CM

## 2020-02-04 MED ORDER — HYDROCODONE-ACETAMINOPHEN 10-325 MG PO TABS: 1.0000 | ORAL_TABLET | ORAL | 0 refills | Status: AC | PRN

## 2020-02-08 ENCOUNTER — Telehealth: Payer: Self-pay

## 2020-02-08 NOTE — Telephone Encounter (Signed)
Copied from CRM [id number]. Topic: General - Other >> Feb 08, 2020  3:27 PM Gwenlyn Fudge wrote: Reason for CRM: PT called stating that she received a call from Sarah. Pt is returning the call and is requesting more information regarding the MRI that was ordered for her. Please advise.

## 2020-02-25 ENCOUNTER — Encounter: Payer: Self-pay | Admitting: Physician Assistant

## 2020-02-25 DIAGNOSIS — F419 Anxiety disorder, unspecified: Secondary | ICD-10-CM

## 2020-02-25 DIAGNOSIS — J3081 Allergic rhinitis due to animal (cat) (dog) hair and dander: Secondary | ICD-10-CM

## 2020-02-25 DIAGNOSIS — G8929 Other chronic pain: Secondary | ICD-10-CM

## 2020-02-25 MED ORDER — MELOXICAM 15 MG PO TABS: 15.0000 mg | ORAL_TABLET | Freq: Every day | ORAL | 1 refills | Status: DC

## 2020-02-25 MED ORDER — CLONAZEPAM 0.5 MG PO TABS: 0.5000 mg | ORAL_TABLET | Freq: Two times a day (BID) | ORAL | 1 refills | Status: DC | PRN

## 2020-02-26 ENCOUNTER — Ambulatory Visit: Admission: RE | Admit: 2020-02-26 | Payer: BC Managed Care – PPO | Source: Ambulatory Visit

## 2020-02-28 MED ORDER — MONTELUKAST SODIUM 10 MG PO TABS: ORAL_TABLET | ORAL | 3 refills | Status: DC

## 2020-02-28 NOTE — Addendum Note (Signed)
Addended by: Marjie Skiff on: 02/28/2020 11:39 AM   Modules accepted: Orders

## 2020-02-29 ENCOUNTER — Encounter: Payer: Self-pay | Admitting: Physician Assistant

## 2020-02-29 DIAGNOSIS — M4710 Other spondylosis with myelopathy, site unspecified: Secondary | ICD-10-CM

## 2020-02-29 MED ORDER — HYDROCODONE-ACETAMINOPHEN 10-325 MG PO TABS: 1.0000 | ORAL_TABLET | ORAL | 0 refills | Status: DC | PRN

## 2020-02-29 NOTE — Telephone Encounter (Signed)
Copied from CRM [id number]. Topic: General - Other >> Feb 29, 2020 11:05 AM Dalphine Handing A wrote: Patient would like a callback from Joseline once pain medication has been sent to pharmacy.

## 2020-03-14 ENCOUNTER — Ambulatory Visit
Admission: RE | Admit: 2020-03-14 | Discharge: 2020-03-14 | Disposition: A | Discharge status: Home / Self Care | Payer: BC Managed Care – PPO | Source: Ambulatory Visit | Attending: Physician Assistant | Admitting: Physician Assistant

## 2020-03-14 ENCOUNTER — Other Ambulatory Visit: Payer: Self-pay

## 2020-03-14 DIAGNOSIS — M4710 Other spondylosis with myelopathy, site unspecified: Secondary | ICD-10-CM | POA: Insufficient documentation

## 2020-03-17 ENCOUNTER — Telehealth: Payer: Self-pay

## 2020-03-17 NOTE — Telephone Encounter (Signed)
Patient advised as directed below. 

## 2020-03-17 NOTE — Telephone Encounter (Signed)
-----   Message from Margaretann Loveless, [place] sent at 03/17/2020  7:36 AM EDT ----- MRI shows facet arthritis and bulging disc in the L4-L5 and L5-S1 regions. States essentially stable when compared to 2019.

## 2020-03-24 ENCOUNTER — Encounter: Payer: Self-pay | Admitting: Physician Assistant

## 2020-03-24 DIAGNOSIS — M4710 Other spondylosis with myelopathy, site unspecified: Secondary | ICD-10-CM

## 2020-03-24 MED ORDER — OXYCODONE-ACETAMINOPHEN 10-325 MG PO TABS: 1.0000 | ORAL_TABLET | ORAL | 0 refills | Status: DC | PRN

## 2020-04-18 ENCOUNTER — Encounter: Payer: Self-pay | Admitting: Physician Assistant

## 2020-04-18 DIAGNOSIS — M4710 Other spondylosis with myelopathy, site unspecified: Secondary | ICD-10-CM

## 2020-04-18 MED ORDER — OXYCODONE-ACETAMINOPHEN 10-325 MG PO TABS: 1.0000 | ORAL_TABLET | ORAL | 0 refills | Status: AC | PRN

## 2020-05-02 ENCOUNTER — Other Ambulatory Visit: Payer: Self-pay | Admitting: Physician Assistant

## 2020-05-02 DIAGNOSIS — F418 Other specified anxiety disorders: Secondary | ICD-10-CM

## 2020-05-02 DIAGNOSIS — F410 Panic disorder [episodic paroxysmal anxiety] without agoraphobia: Secondary | ICD-10-CM

## 2020-05-07 ENCOUNTER — Encounter: Payer: Self-pay | Admitting: Physician Assistant

## 2020-05-07 DIAGNOSIS — R3989 Other symptoms and signs involving the genitourinary system: Secondary | ICD-10-CM

## 2020-05-07 MED ORDER — CEPHALEXIN 500 MG PO CAPS: 500.0000 mg | ORAL_CAPSULE | Freq: Two times a day (BID) | ORAL | 0 refills | Status: AC

## 2020-05-08 IMAGING — CR DG LUMBAR SPINE COMPLETE 4+V
1 series · 6 of 6 positions shown · non-contrast
Comparison: None.

CLINICAL DATA: Low back pain

EXAM:
LUMBAR SPINE - COMPLETE 4+ VIEW

[Series 1: dg lumbar spine complete 4 +v · 0.14mm/px · 6 of 6 slices shown]
[im 1/6]
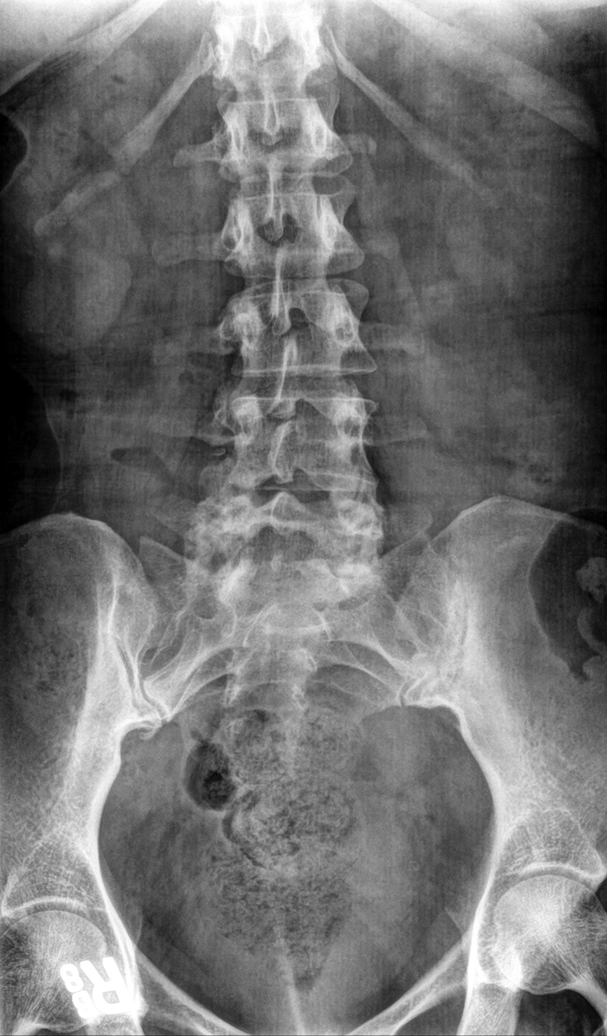
[im 2/6]
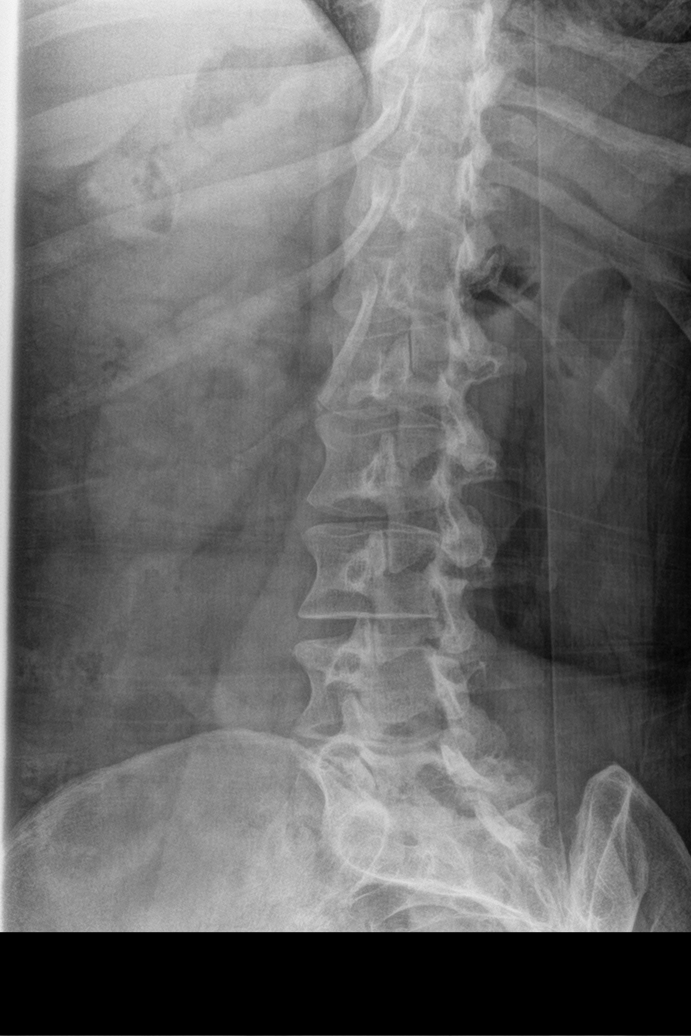
[im 3/6]
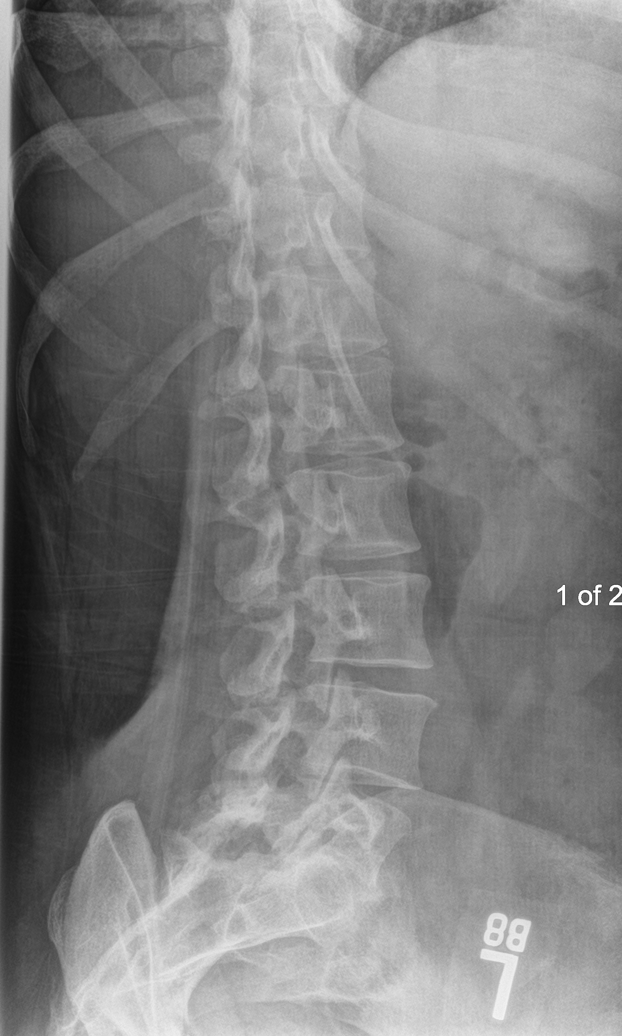
[im 4/6]
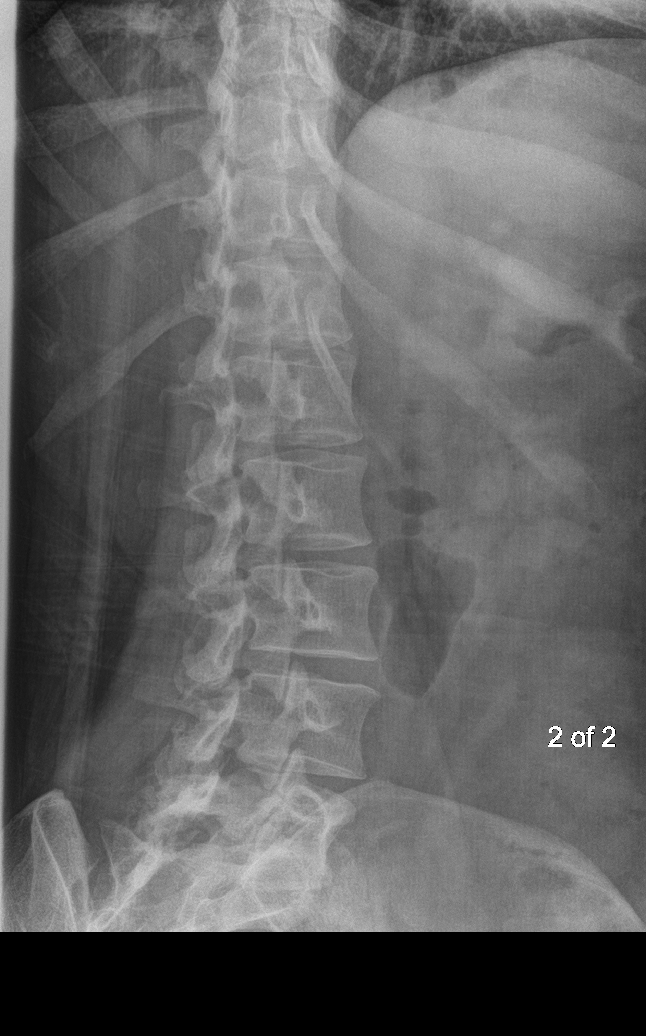
[im 5/6]
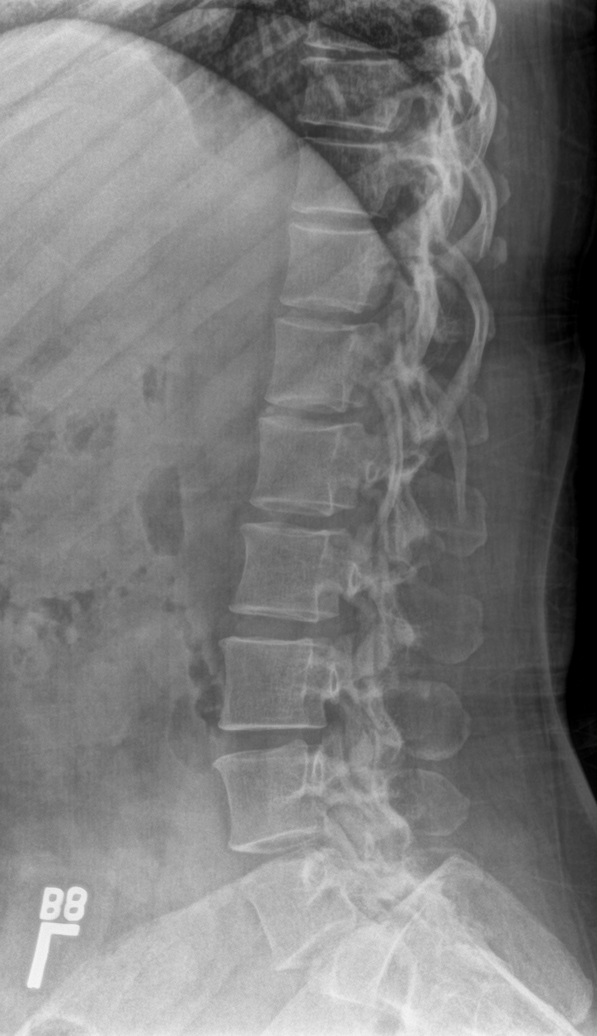
[im 6/6]
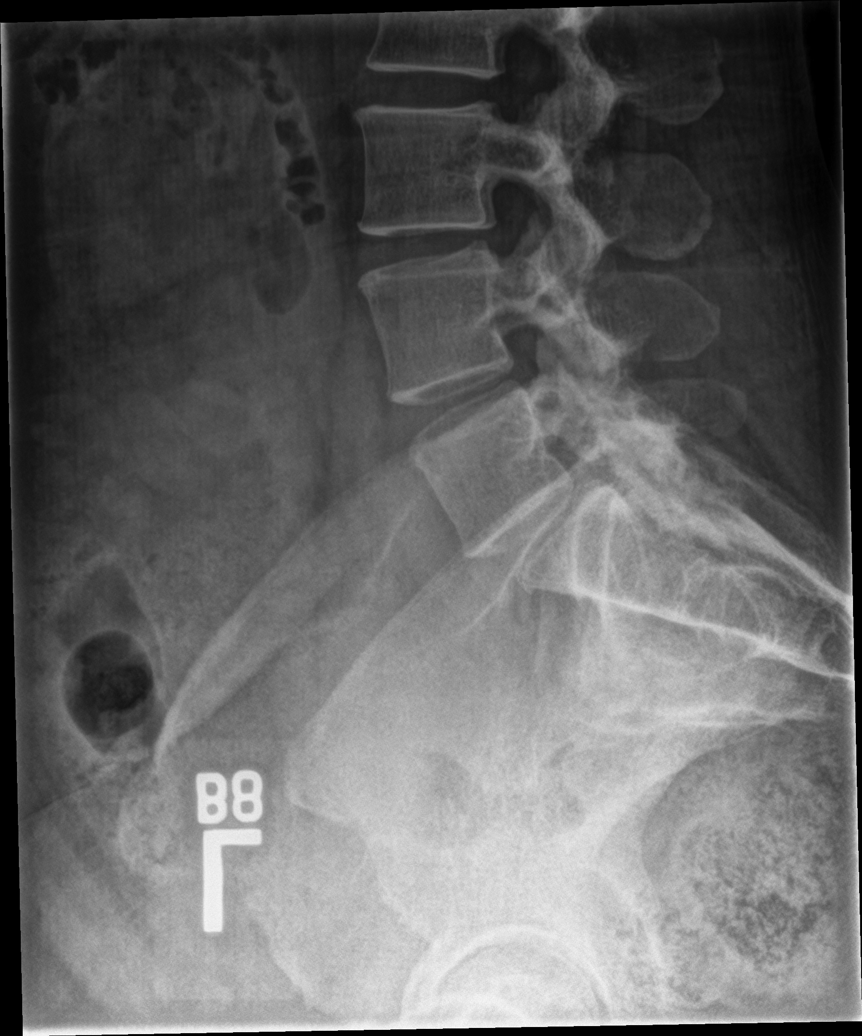

[6 of 6 positions shown; findings below may reference images not displayed]

FINDINGS: There is no evidence of lumbar spine fracture. Minimal grade 1
anterolisthesis of L5 on S1. Mild levocurvature of the lumbar spine.
Mild degenerative disc disease with disc height loss at L4-5 and
L5-S1 and to lesser extent L3-4. Bilateral facet arthropathy at L4-5
and L5-S1.

Mild osteoarthritis of the right sacroiliac joint.
IMPRESSION: Lumbar spine spondylosis as described above.

## 2020-05-09 ENCOUNTER — Other Ambulatory Visit: Payer: Self-pay | Admitting: Physician Assistant

## 2020-05-09 DIAGNOSIS — F419 Anxiety disorder, unspecified: Secondary | ICD-10-CM

## 2020-05-09 NOTE — Telephone Encounter (Signed)
Requested medication (s) are due for refill today: yes  Requested medication (s) are on the active medication list: yes  Last refill: 02/25/20  #60  1 refill  Future visit scheduled: No  Notes to clinic: Not delegated.  Urine drug screen needed    Requested Prescriptions  Pending Prescriptions Disp Refills   clonazePAM (KLONOPIN) 0.5 MG tablet 60 tablet 1    Sig: Take 1 tablet (0.5 mg total) by mouth 2 (two) times daily as needed for anxiety.      Not Delegated - Psychiatry:  Anxiolytics/Hypnotics Failed - 05/09/2020  3:44 PM      Failed - This refill cannot be delegated      Failed - Urine Drug Screen completed in last 360 days.      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

## 2020-05-09 NOTE — Telephone Encounter (Signed)
Medication Refill - Medication: Clonazepam  Has the patient contacted their pharmacy? Yes.   (Agent: If no, request that the patient contact the pharmacy for the refill.) (Agent: If yes, when and what did the pharmacy advise?)  Preferred Pharmacy (with phone number or street name): [id number] [place], [place] - [address]  Agent: Please be advised that RX refills may take up to 3 business days. We ask that you follow-up with your pharmacy.

## 2020-05-09 NOTE — Telephone Encounter (Signed)
Requested medication (s) are due for refill today:yes  Requested medication (s) are on the active medication list: yes  Last refill:  02/25/20  #60  0 refills  Future visit scheduled: No  Notes to clinic:  not delegated    Requested Prescriptions  Pending Prescriptions Disp Refills   clonazePAM (KLONOPIN) 0.5 MG tablet [Pharmacy Med Name: CLONAZEPAM 0.5 MG TABLET] 60 tablet 1    Sig: Take 1 tablet (0.5 mg total) by mouth 2 (two) times daily as needed for anxiety.      Not Delegated - Psychiatry:  Anxiolytics/Hypnotics Failed - 05/09/2020  2:52 PM      Failed - This refill cannot be delegated      Failed - Urine Drug Screen completed in last 360 days.      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

## 2020-05-12 MED ORDER — CLONAZEPAM 0.5 MG PO TABS: 0.5000 mg | ORAL_TABLET | Freq: Two times a day (BID) | ORAL | 5 refills | Status: DC | PRN

## 2020-05-23 ENCOUNTER — Other Ambulatory Visit: Payer: Self-pay | Admitting: Family Medicine

## 2020-05-23 ENCOUNTER — Encounter: Payer: Self-pay | Admitting: Physician Assistant

## 2020-05-23 DIAGNOSIS — G8929 Other chronic pain: Secondary | ICD-10-CM

## 2020-05-23 DIAGNOSIS — M4710 Other spondylosis with myelopathy, site unspecified: Secondary | ICD-10-CM

## 2020-05-23 MED ORDER — MELOXICAM 15 MG PO TABS: 15.0000 mg | ORAL_TABLET | Freq: Every day | ORAL | 1 refills | Status: DC

## 2020-05-23 NOTE — Telephone Encounter (Signed)
Pt called to check status of refill but Casey Carter wasn't here today.  She wanted me to send this message back and see if someone could give her enough until Monday.  [facility] [place] [facility]  [phone]

## 2020-05-26 ENCOUNTER — Encounter: Payer: Self-pay | Admitting: Physician Assistant

## 2020-05-26 MED ORDER — OXYCODONE-ACETAMINOPHEN 10-325 MG PO TABS: 1.0000 | ORAL_TABLET | ORAL | 0 refills | Status: DC | PRN

## 2020-05-26 NOTE — Addendum Note (Signed)
Addended by: Margaretann Loveless on: 05/26/2020 03:31 PM   Modules accepted: Orders

## 2020-05-26 NOTE — Telephone Encounter (Signed)
Pt called again regarding this medication. She is requesting to have this sent today if possible. Pt states that it was supposed to have the oxycodone sent in for her. Please advise.

## 2020-06-17 ENCOUNTER — Encounter: Payer: Self-pay | Admitting: Physician Assistant

## 2020-06-17 DIAGNOSIS — G8929 Other chronic pain: Secondary | ICD-10-CM

## 2020-06-17 MED ORDER — OXYCODONE-ACETAMINOPHEN 10-325 MG PO TABS: 1.0000 | ORAL_TABLET | ORAL | 0 refills | Status: DC | PRN

## 2020-06-24 ENCOUNTER — Other Ambulatory Visit: Payer: Self-pay | Admitting: Physician Assistant

## 2020-06-24 DIAGNOSIS — K219 Gastro-esophageal reflux disease without esophagitis: Secondary | ICD-10-CM

## 2020-07-10 ENCOUNTER — Encounter: Payer: Self-pay | Admitting: Physician Assistant

## 2020-07-10 DIAGNOSIS — M5442 Lumbago with sciatica, left side: Secondary | ICD-10-CM

## 2020-07-11 MED ORDER — OXYCODONE-ACETAMINOPHEN 10-325 MG PO TABS: 1.0000 | ORAL_TABLET | ORAL | 0 refills | Status: DC | PRN

## 2020-07-11 NOTE — Telephone Encounter (Signed)
Pt called the office to ask if she can get the refill on her pain medication.  She has enough to just get her through the weekend  [facility] [place]  CB#  [phone]

## 2020-07-23 ENCOUNTER — Other Ambulatory Visit: Payer: Self-pay | Admitting: Physician Assistant

## 2020-07-23 DIAGNOSIS — F32A Depression, unspecified: Secondary | ICD-10-CM

## 2020-07-23 MED ORDER — ESCITALOPRAM OXALATE 20 MG PO TABS: ORAL_TABLET | ORAL | 1 refills | Status: DC

## 2020-07-23 MED ORDER — BUPROPION HCL ER (XL) 150 MG PO TB24: 150.0000 mg | ORAL_TABLET | Freq: Every day | ORAL | 1 refills | Status: DC

## 2020-07-23 NOTE — Telephone Encounter (Signed)
[facility] faxed refill request for the following medications:  buPROPion (WELLBUTRIN XL) 150 MG 24 hr tablet escitalopram (LEXAPRO) 20 MG tablet  Please advise. Thanks, [employer]

## 2020-08-06 ENCOUNTER — Encounter: Payer: Self-pay | Admitting: Physician Assistant

## 2020-08-06 DIAGNOSIS — G8929 Other chronic pain: Secondary | ICD-10-CM

## 2020-08-06 DIAGNOSIS — M5442 Lumbago with sciatica, left side: Secondary | ICD-10-CM

## 2020-08-06 MED ORDER — OXYCODONE-ACETAMINOPHEN 10-325 MG PO TABS: 1.0000 | ORAL_TABLET | ORAL | 0 refills | Status: DC | PRN

## 2020-09-01 ENCOUNTER — Encounter: Payer: Self-pay | Admitting: Physician Assistant

## 2020-09-01 DIAGNOSIS — M5442 Lumbago with sciatica, left side: Secondary | ICD-10-CM

## 2020-09-01 DIAGNOSIS — G8929 Other chronic pain: Secondary | ICD-10-CM

## 2020-09-01 MED ORDER — OXYCODONE-ACETAMINOPHEN 10-325 MG PO TABS: 1.0000 | ORAL_TABLET | ORAL | 0 refills | Status: DC | PRN

## 2020-09-22 ENCOUNTER — Encounter: Payer: Self-pay | Admitting: Physician Assistant

## 2020-09-22 DIAGNOSIS — G8929 Other chronic pain: Secondary | ICD-10-CM

## 2020-09-22 DIAGNOSIS — M5442 Lumbago with sciatica, left side: Secondary | ICD-10-CM

## 2020-09-22 MED ORDER — OXYCODONE-ACETAMINOPHEN 10-325 MG PO TABS: 1.0000 | ORAL_TABLET | ORAL | 0 refills | Status: DC | PRN

## 2020-10-16 ENCOUNTER — Encounter: Payer: Self-pay | Admitting: Physician Assistant

## 2020-10-16 MED ORDER — OXYCODONE-ACETAMINOPHEN 10-325 MG PO TABS: 1.0000 | ORAL_TABLET | ORAL | 0 refills | Status: DC | PRN

## 2020-10-16 NOTE — Addendum Note (Signed)
Addended by: Margaretann Loveless on: 10/16/2020 04:16 PM   Modules accepted: Orders

## 2020-10-24 ENCOUNTER — Encounter: Payer: Self-pay | Admitting: Physician Assistant

## 2020-10-24 DIAGNOSIS — E6609 Other obesity due to excess calories: Secondary | ICD-10-CM

## 2020-10-24 DIAGNOSIS — Z713 Dietary counseling and surveillance: Secondary | ICD-10-CM

## 2020-10-24 MED ORDER — PHENTERMINE HCL 37.5 MG PO TABS
37.5000 mg | ORAL_TABLET | Freq: Every day | ORAL | 2 refills | Status: DC
Start: 2020-10-24 — End: 2021-03-12

## 2020-11-13 ENCOUNTER — Telehealth: Payer: Self-pay

## 2020-11-13 DIAGNOSIS — G8929 Other chronic pain: Secondary | ICD-10-CM

## 2020-11-13 DIAGNOSIS — M5442 Lumbago with sciatica, left side: Secondary | ICD-10-CM

## 2020-11-13 MED ORDER — OXYCODONE-ACETAMINOPHEN 7.5-325 MG PO TABS
1.0000 | ORAL_TABLET | ORAL | 0 refills | Status: DC | PRN
Start: 2020-11-13 — End: 2020-12-11

## 2020-11-13 NOTE — Telephone Encounter (Signed)
Copied from CRM [id number]. Topic: General - Other >> Nov 13, 2020  2:42 PM Mcneil, Ja-Kwan wrote: Reason for CRM: Pt was prescribed oxyCODONE-acetaminophen (PERCOCET) 10-325 MG tablet but she wants to taper down to oxyCODONE-acetaminophen (PERCOCET) 7.5-325 MG tablet. Pt requests that the new Rx for oxyCODONE-acetaminophen (PERCOCET) 7.5-325 [id number] [place], [place] - [address]

## 2020-11-13 NOTE — Addendum Note (Signed)
Addended by: Malva Limes on: 11/13/2020 04:25 PM   Modules accepted: Orders

## 2020-11-19 ENCOUNTER — Other Ambulatory Visit: Payer: Self-pay | Admitting: Physician Assistant

## 2020-11-19 DIAGNOSIS — K219 Gastro-esophageal reflux disease without esophagitis: Secondary | ICD-10-CM

## 2020-12-09 ENCOUNTER — Telehealth: Payer: Self-pay | Admitting: Physician Assistant

## 2020-12-09 DIAGNOSIS — F419 Anxiety disorder, unspecified: Secondary | ICD-10-CM

## 2020-12-10 NOTE — Telephone Encounter (Signed)
Requested medication (s) are due for refill today: yes   Requested medication (s) are on the active medication list: yes   Last refill: 10/27/2020  Future visit scheduled: no  Notes to clinic:  this refill cannot be delegated    Requested Prescriptions  Pending Prescriptions Disp Refills   clonazePAM (KLONOPIN) 0.5 MG tablet [Pharmacy Med Name: CLONAZEPAM 0.5 MG TABLET] 60 tablet     Sig: TAKE 1 TABLET BY MOUTH 2 TIMES DAILY AS NEEDED FOR ANXIETY.      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

## 2020-12-10 NOTE — Telephone Encounter (Signed)
Pt made an appt for tomorrow with Dr. Sullivan Lone and states that she is out of medication as of last night and does not have any for today.  She is requesting enough to last until she is seen at least.

## 2020-12-11 ENCOUNTER — Other Ambulatory Visit: Payer: Self-pay

## 2020-12-11 ENCOUNTER — Telehealth (INDEPENDENT_AMBULATORY_CARE_PROVIDER_SITE_OTHER): Payer: BC Managed Care – PPO | Admitting: Family Medicine

## 2020-12-11 ENCOUNTER — Ambulatory Visit: Payer: BC Managed Care – PPO | Admitting: Family Medicine

## 2020-12-11 DIAGNOSIS — E6609 Other obesity due to excess calories: Secondary | ICD-10-CM

## 2020-12-11 DIAGNOSIS — F418 Other specified anxiety disorders: Secondary | ICD-10-CM | POA: Diagnosis not present

## 2020-12-11 DIAGNOSIS — M5442 Lumbago with sciatica, left side: Secondary | ICD-10-CM | POA: Diagnosis not present

## 2020-12-11 DIAGNOSIS — J0191 Acute recurrent sinusitis, unspecified: Secondary | ICD-10-CM

## 2020-12-11 DIAGNOSIS — F410 Panic disorder [episodic paroxysmal anxiety] without agoraphobia: Secondary | ICD-10-CM

## 2020-12-11 DIAGNOSIS — F419 Anxiety disorder, unspecified: Secondary | ICD-10-CM

## 2020-12-11 DIAGNOSIS — Z6834 Body mass index (BMI) 34.0-34.9, adult: Secondary | ICD-10-CM

## 2020-12-11 DIAGNOSIS — G8929 Other chronic pain: Secondary | ICD-10-CM

## 2020-12-11 DIAGNOSIS — M5441 Lumbago with sciatica, right side: Secondary | ICD-10-CM

## 2020-12-11 MED ORDER — CLONAZEPAM 0.5 MG PO TABS: 0.5000 mg | ORAL_TABLET | Freq: Two times a day (BID) | ORAL | 5 refills | Status: AC | PRN

## 2020-12-11 MED ORDER — AMOXICILLIN-POT CLAVULANATE 875-125 MG PO TABS: 1.0000 | ORAL_TABLET | Freq: Two times a day (BID) | ORAL | 0 refills | Status: AC

## 2020-12-11 MED ORDER — OXYCODONE-ACETAMINOPHEN 7.5-325 MG PO TABS
1.0000 | ORAL_TABLET | ORAL | 0 refills | Status: DC | PRN
Start: 2020-12-11 — End: 2021-01-08

## 2020-12-11 NOTE — Progress Notes (Deleted)
      Established patient visit   Patient: Casey Carter   DOB: 10/16/1975   45 y.o. Female  MRN: [id number] Visit Date: 12/11/2020  Today's healthcare provider: Megan Mans, MD   No chief complaint on file.  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 Changes made at last visit include refilled Oxycodone.  She reports {excellent/good/fair/poor:19665} compliance with treatment. She feels that condition is {improved/worse/unchanged:3041574}. She {is/is not:21021397} having side effects. ***  {Show patient history (optional):23778::" "}   Medications: Outpatient Medications Prior to Visit  Medication Sig  . albuterol (PROVENTIL HFA;VENTOLIN HFA) 108 (90 Base) MCG/ACT inhaler Inhale 2 puffs into the lungs every 6 (six) hours as needed for wheezing or shortness of breath.  Marland Kitchen buPROPion (WELLBUTRIN XL) 150 MG 24 hr tablet Take 1 tablet (150 mg total) by mouth daily.  . cephALEXin (KEFLEX) 500 MG capsule Take 1 capsule (500 mg total) by mouth 2 (two) times daily.  . clonazePAM (KLONOPIN) 0.5 MG tablet Take 1 tablet (0.5 mg total) by mouth 2 (two) times daily as needed for anxiety.  Marland Kitchen dexlansoprazole (DEXILANT) 60 MG capsule TAKE 1 CAPSULE BY MOUTH EVERY DAY  . escitalopram (LEXAPRO) 20 MG tablet TAKE 1 TABLET(20 MG) BY MOUTH DAILY  . FLOVENT HFA 110 MCG/ACT inhaler INHALE 2 PUFFS BY MOUTH INTO THE LUNGS DAILY  . meloxicam (MOBIC) 15 MG tablet Take 1 tablet (15 mg total) by mouth daily.  . montelukast (SINGULAIR) 10 MG tablet TAKE 1 TABLET BY MOUTH EVERYDAY AT BEDTIME  . oxyCODONE-acetaminophen (PERCOCET) 7.5-325 MG tablet Take 1 tablet by mouth every 4 (four) hours as needed for severe pain.  . phentermine (ADIPEX-P) 37.5 MG tablet Take 1 tablet (37.5 mg total) by mouth daily before breakfast.  . Vitamin D, Ergocalciferol, (DRISDOL) 1.25 MG (50000 UT) CAPS capsule  TAKE 1 CAPSULE BY MOUTH EVERY 7 DAYS   No facility-administered medications prior to visit.    Review of Systems  {Labs  Heme  Chem  Endocrine  Serology  Results Review (optional):23779::" "}   Objective    There were no vitals taken for this visit. {Show previous vital signs (optional):23777::" "}   Physical Exam  ***  No results found for any visits on 12/11/20.  Assessment & Plan     ***  No follow-ups on file.      {provider attestation***:1}   Megan Mans, MD  [place] [phone] (phone) [phone] (fax)  [facility]

## 2020-12-11 NOTE — Progress Notes (Signed)
Virtual telephone visit    Virtual Visit via Telephone Note   This visit type was conducted due to national recommendations for restrictions regarding the COVID-19 Pandemic (e.g. social distancing) in an effort to limit this patient's exposure and mitigate transmission in our community. Due to her co-morbid illnesses, this patient is at least at moderate risk for complications without adequate follow up. This format is felt to be most appropriate for this patient at this time. The patient did not have access to video technology or had technical difficulties with video requiring transitioning to audio format only (telephone). Physical exam was limited to content and character of the telephone converstion.    Patient location: home Provider location: office  I discussed the limitations of evaluation and management by telemedicine and the availability of in person appointments. The patient expressed understanding and agreed to proceed.   Visit Date: 12/11/2020  Today's healthcare provider: Megan Mans, MD   No chief complaint on file.  Subjective    HPI  Patient has not been seen in the office for a year.  She was scheduled for follow-up for refill of her narcotics and clonazepam.  She had back surgery in the fall.  Regarding needs refills she states she does not take them together.  She is down to from 10 to 7.5 mg of Percocet.  She does plan to wean off.  I told her that would be absolutely necessary for [place] to be willing to write any prescriptions.  I am willing to willing to write a decreasing dose the plan to be off of it by the end of the year.  She is aware of this.  She agrees with this plan. The other issue is the patient woke up this morning sick and thinks she has a sinus infection.  She has a headache and thick nasal green discharge.  She has had 2 COVID vaccines plus a booster.  There is a huge COVID spike in the community last week.  Follow up for Spinal cord  compression due to degenerative disorder of spinal column  The patient was last seen for this 11 months ago. Changes made at last visit include refilling Oxycodone.  She reports good compliance with treatment. She feels that condition is Improved. She is not having side effects.         Medications: Outpatient Medications Prior to Visit  Medication Sig  . albuterol (PROVENTIL HFA;VENTOLIN HFA) 108 (90 Base) MCG/ACT inhaler Inhale 2 puffs into the lungs every 6 (six) hours as needed for wheezing or shortness of breath.  Marland Kitchen buPROPion (WELLBUTRIN XL) 150 MG 24 hr tablet Take 1 tablet (150 mg total) by mouth daily.  . cephALEXin (KEFLEX) 500 MG capsule Take 1 capsule (500 mg total) by mouth 2 (two) times daily.  . clonazePAM (KLONOPIN) 0.5 MG tablet Take 1 tablet (0.5 mg total) by mouth 2 (two) times daily as needed for anxiety.  Marland Kitchen dexlansoprazole (DEXILANT) 60 MG capsule TAKE 1 CAPSULE BY MOUTH EVERY DAY  . escitalopram (LEXAPRO) 20 MG tablet TAKE 1 TABLET(20 MG) BY MOUTH DAILY  . FLOVENT HFA 110 MCG/ACT inhaler INHALE 2 PUFFS BY MOUTH INTO THE LUNGS DAILY  . meloxicam (MOBIC) 15 MG tablet Take 1 tablet (15 mg total) by mouth daily.  . montelukast (SINGULAIR) 10 MG tablet TAKE 1 TABLET BY MOUTH EVERYDAY AT BEDTIME  . oxyCODONE-acetaminophen (PERCOCET) 7.5-325 MG tablet Take 1 tablet by mouth every 4 (four) hours as needed for severe pain.  Marland Kitchen  phentermine (ADIPEX-P) 37.5 MG tablet Take 1 tablet (37.5 mg total) by mouth daily before breakfast.  . Vitamin D, Ergocalciferol, (DRISDOL) 1.25 MG (50000 UT) CAPS capsule TAKE 1 CAPSULE BY MOUTH EVERY 7 DAYS   No facility-administered medications prior to visit.    Review of Systems     Objective    There were no vitals taken for this visit. Wt Readings from Last 3 Encounters:  01/10/20 225 lb 12.8 oz (102.4 kg)  10/15/19 221 lb (100.2 kg)  02/09/19 222 lb (100.7 kg)        Assessment & Plan     1. Acute recurrent sinusitis,  unspecified location Augmentin for 10 days.  I instructed the patient she really needs to get a COVID PCR test done.  I think it is just as likely she has COVID.  2. Chronic bilateral low back pain with bilateral sciatica I told the patient we need to move towards getting her off the medication as she had surgery last year.  If she cannot get continue to wean we need to refer her to the pain clinic.  I am only willing to refill her with as she is weaning.  Next prescription should be 5 mg and then cutting back from there.  3. Class 1 obesity due to excess calories with serious comorbidity and body mass index (BMI) of 34.0 to 34.9 in adult   4. Situational anxiety Clonazepam reluctantly refilled.  I told her this was a dangerous combination to be on.  5. Panic attacks   6.GAD - clonazePAM (KLONOPIN) 0.5 MG tablet; Take 1 tablet (0.5 mg total) by mouth 2 (two) times daily as needed for anxiety.  Dispense: 60 tablet; Refill: 5   No follow-ups on file.    I discussed the assessment and treatment plan with the patient. The patient was provided an opportunity to ask questions and all were answered. The patient agreed with the plan and demonstrated an understanding of the instructions.   The patient was advised to call back or seek an in-person evaluation if the symptoms worsen or if the condition fails to improve as anticipated.  I provided 16 minutes of non-face-to-face time during this encounter.  I, Megan Mans, MD, have reviewed all documentation for this visit. The documentation on 12/11/20 for the exam, diagnosis, procedures, and orders are all accurate and complete.   Alani Sabbagh Wendelyn Breslow, MD [facility] [phone] (phone) [phone] (fax)  [facility]

## 2021-01-08 ENCOUNTER — Other Ambulatory Visit: Payer: Self-pay

## 2021-01-08 MED ORDER — OXYCODONE-ACETAMINOPHEN 7.5-325 MG PO TABS: 1.0000 | ORAL_TABLET | ORAL | 0 refills | Status: DC | PRN

## 2021-01-08 NOTE — Telephone Encounter (Signed)
Patient is requesting a refill on the following medication   oxyCODONE-acetaminophen (PERCOCET) 7.5-325 MG tablet  I advised pt that Dr. Sullivan Lone stated in his note that he would only prescribe a refill for 5mg  to wean her off.  She stated that they discussed her getting 7.5 one more time and then going down.  I let her know that Dr. did not state that in his note and probably would not be able to do that.  [facility] [place]  Pt will be out of medication on Sunday 01/11/2021. Please advise.

## 2021-01-08 NOTE — Addendum Note (Signed)
Addended by: Bosie Clos on: 01/08/2021 06:26 PM   Modules accepted: Orders

## 2021-01-08 NOTE — Telephone Encounter (Signed)
Please review. KW 

## 2021-01-19 ENCOUNTER — Other Ambulatory Visit: Payer: Self-pay

## 2021-01-19 ENCOUNTER — Other Ambulatory Visit: Payer: Self-pay | Admitting: Physician Assistant

## 2021-01-19 DIAGNOSIS — F32A Depression, unspecified: Secondary | ICD-10-CM

## 2021-01-19 NOTE — Telephone Encounter (Signed)
[facility] faxed refill request for the following medications:  escitalopram (LEXAPRO) 20 MG tablet  Please advise. Thanks TNP

## 2021-01-20 MED ORDER — ESCITALOPRAM OXALATE 20 MG PO TABS: ORAL_TABLET | ORAL | 1 refills | Status: AC

## 2021-01-20 NOTE — Telephone Encounter (Signed)
Please review. 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

## 2021-02-06 ENCOUNTER — Encounter: Payer: Self-pay | Admitting: Physician Assistant

## 2021-02-06 ENCOUNTER — Other Ambulatory Visit: Payer: Self-pay | Admitting: Physician Assistant

## 2021-02-06 ENCOUNTER — Other Ambulatory Visit: Payer: Self-pay

## 2021-02-06 NOTE — Addendum Note (Signed)
Addended by: Kavin Leech E on: 02/06/2021 02:29 PM   Modules accepted: Orders

## 2021-02-06 NOTE — Telephone Encounter (Signed)
Medication Refill - Medication: oxyCODONE-acetaminophen (PERCOCET) 7.5-325 MG tablet   Has the patient contacted their pharmacy? yes (Agent: If no, request that the patient contact the pharmacy for the refill.) (Agent: If yes, when and what did the pharmacy advise?)contact pcp  Preferred Pharmacy (with phone number or street name): [id number] [place] [place] - [address] Phone:  [phone]  Fax:  [phone]      Agent: Please be advised that RX refills may take up to 3 business days. We ask that you follow-up with your pharmacy.

## 2021-02-06 NOTE — Telephone Encounter (Signed)
Copied from CRM [id number]. Topic: General - Other >> Feb 06, 2021  2:53 PM Casey Carter, [place] wrote: Reason for JQB:[id number] asking for short supply of oxyCODONE-acetaminophen (PERCOCET) 7.5-325 MG tablet unitl her refill comes in. Please call back

## 2021-02-06 NOTE — Telephone Encounter (Signed)
Pt is calling to request a refill of oxycodone Percocet - 5.0mg .  [facility] [place] Pt will be out out of medication. Cb- [phone]

## 2021-02-06 NOTE — Telephone Encounter (Deleted)
p 

## 2021-02-07 ENCOUNTER — Other Ambulatory Visit: Payer: Self-pay | Admitting: Family Medicine

## 2021-02-07 ENCOUNTER — Telehealth: Payer: BC Managed Care – PPO | Admitting: Physician Assistant

## 2021-02-07 DIAGNOSIS — M5442 Lumbago with sciatica, left side: Secondary | ICD-10-CM

## 2021-02-07 DIAGNOSIS — K219 Gastro-esophageal reflux disease without esophagitis: Secondary | ICD-10-CM

## 2021-02-07 DIAGNOSIS — G8929 Other chronic pain: Secondary | ICD-10-CM

## 2021-02-07 MED ORDER — ETODOLAC 300 MG PO CAPS: 300.0000 mg | ORAL_CAPSULE | Freq: Three times a day (TID) | ORAL | 0 refills | Status: AC

## 2021-02-07 MED ORDER — CYCLOBENZAPRINE HCL 10 MG PO TABS
10.0000 mg | ORAL_TABLET | Freq: Three times a day (TID) | ORAL | 0 refills | Status: AC | PRN
Start: 2021-02-07 — End: ?

## 2021-02-07 NOTE — Progress Notes (Signed)
Patient sent second request after being told to seek care in person for narcotic pain medication.

## 2021-02-07 NOTE — Telephone Encounter (Signed)
Requested medication (s) are due for refill today: yes  Requested medication (s) are on the active medication list: yes  Last refill:  05/23/20 #90 1 RF  Future visit scheduled: yes  Notes to clinic:  overdue lab work   Requested Prescriptions  Pending Prescriptions Disp Refills   meloxicam (MOBIC) 15 MG tablet [Pharmacy Med Name: MELOXICAM 15 MG TABLET] 90 tablet 1    Sig: TAKE 1 TABLET BY MOUTH EVERY DAY      Analgesics:  COX2 Inhibitors Failed - 02/07/2021 11:52 AM      Failed - HGB in normal range and within 360 days    Hemoglobin  Date Value Ref Range Status  10/15/2019 12.5 11.1 - 15.9 g/dL Final          Failed - Cr in normal range and within 360 days    Creatinine, Ser  Date Value Ref Range Status  10/15/2019 0.66 0.57 - 1.00 mg/dL Final          Passed - Patient is not pregnant      Passed - Valid encounter within last 12 months    Recent Outpatient Visits           1 month ago Acute recurrent sinusitis, unspecified location   [facility] Maple Hudson., MD   1 year ago 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

## 2021-02-07 NOTE — Telephone Encounter (Signed)
Refilled today 02/07/21

## 2021-02-07 NOTE — Telephone Encounter (Signed)
Med change request. Protonix not covered though pt's insurance- requesting Dexilant

## 2021-02-07 NOTE — Telephone Encounter (Signed)
Requested Prescriptions  Pending Prescriptions Disp Refills  . 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

## 2021-02-07 NOTE — Progress Notes (Signed)
We are sorry that you are not feeling well.  Here is how we plan to help!  Based on what you have shared with me it looks like you mostly have acute back pain.  Acute back pain is defined as musculoskeletal pain that can resolve in 1-3 weeks with conservative treatment.  I have prescribed Etodolac 300 mg take one by mouth twice a day non-steroid anti-inflammatory (NSAID) as well as Flexeril 10 mg every eight hours as needed which is a muscle relaxer  Some patients experience stomach irritation or in increased heartburn with anti-inflammatory drugs.  Please keep in mind that muscle relaxer's can cause fatigue and should not be taken while at work or driving.  Back pain is very common.  The pain often gets better over time.  The cause of back pain is usually not dangerous.  Most people can learn to manage their back pain on their own.  We cannot prescribe any narcotics or controlled substances within eVisits.  This includes cough medications that contain codeine. If you feel that you need these, a face to face with a provider is required.     Home Care Stay active.  Start with short walks on flat ground if you can.  Try to walk farther each day. Do not sit, drive or stand in one place for more than 30 minutes.  Do not stay in bed. Do not avoid exercise or work.  Activity can help your back heal faster. Be careful when you bend or lift an object.  Bend at your knees, keep the object close to you, and do not twist. Sleep on a firm mattress.  Lie on your side, and bend your knees.  If you lie on your back, put a pillow under your knees. Only take medicines as told by your doctor. Put ice on the injured area. Put ice in a plastic bag Place a towel between your skin and the bag Leave the ice on for 15-20 minutes, 3-4 times a day for the first 2-3 days. 210 After that, you can switch between ice and heat packs. Ask your doctor about back exercises or massage. Avoid feeling anxious or stressed.  Find  good ways to deal with stress, such as exercise.  Get Help Right Way If: Your pain does not go away with rest or medicine. Your pain does not go away in 1 week. You have new problems. You do not feel well. The pain spreads into your legs. You cannot control when you poop (bowel movement) or pee (urinate) You feel sick to your stomach (nauseous) or throw up (vomit) You have belly (abdominal) pain. You feel like you may pass out (faint). If you develop a fever.  Make Sure you: Understand these instructions. Will watch your condition Will get help right away if you are not doing well or get worse.  Your e-visit answers were reviewed by a board certified advanced clinical practitioner to complete your personal care plan.  Depending on the condition, your plan could have included both over the counter or prescription medications.  If there is a problem please reply  once you have received a response from your provider.  Your safety is important to [place].  If you have drug allergies check your prescription carefully.    You can use MyChart to ask questions about today's visit, request a non-urgent call back, or ask for a work or school excuse for 24 hours related to this e-Visit. If it has been greater than 24  hours you will need to follow up with your provider, or enter a new e-Visit to address those concerns.  You will get an e-mail in the next two days asking about your experience.  I hope that your e-visit has been valuable and will speed your recovery. Thank you for using e-visits.  I provided 6 minutes of non face-to-face time during this encounter for chart review and documentation.

## 2021-02-09 ENCOUNTER — Telehealth: Payer: Self-pay

## 2021-02-09 MED ORDER — OXYCODONE-ACETAMINOPHEN 7.5-325 MG PO TABS: 1.0000 | ORAL_TABLET | ORAL | 0 refills | Status: DC | PRN

## 2021-02-09 NOTE — Telephone Encounter (Signed)
Noted  

## 2021-02-09 NOTE — Telephone Encounter (Signed)
Copied from CRM [id number]. Topic: General - Inquiry >> Feb 09, 2021 11:02 AM Aretta Nip wrote: oxyCODONE-acetaminophen (PERCOCET) 7.5-325 MG tablet 180 tablet 0 01/08/2021   Sig - Route: Take 1 tablet by mouth every 4 (four) hours as needed for severe pain. - Oral  Sent to pharmacy as: oxyCODONE-acetaminophen (PERCOCET) 7.5-325 MG tablet  Earliest Fill Date: 01/08/2021  [id number] [place], [place] - [address]  Phone:  [phone]  Pls FU with pt ... states talked to someone over the phone Fri stating this med would be called in, notes state that must have office visit. Pt insist that she was called and meds were to be filled, no documentation, pls advise pt and clarify any misunderstand at [phone]

## 2021-02-09 NOTE — Telephone Encounter (Signed)
Patient is requesting refills on her medication. She reports that she is out. She states that she requested this last week, and no response from our office. [facility]. Former [name] pt. Thanks.

## 2021-02-09 NOTE — Telephone Encounter (Signed)
FYI-patient is in the process of weaning off of her oxycodone.   Medication.  She has appointment with you on 03/12/21.  She is extremely anxious about doing this and would like to discuss the possible use of something that would help once she comes off I.e. Clonidine taper.  She asked that I put a note in her chart regarding this.

## 2021-02-09 NOTE — Telephone Encounter (Signed)
Rx was sent to pharmacy earlier today. ?

## 2021-02-09 NOTE — Telephone Encounter (Signed)
Pt called about her refill for Oxycodone/ she stated she will need her next dose and is out of medication / she stated she requested on Friday and hasnt heard anything/ pt would like the refill sent today/ she stated she had a phone appt in may for med refills / please advise asap

## 2021-02-10 NOTE — Telephone Encounter (Signed)
Please advise medication change? 

## 2021-02-10 NOTE — Telephone Encounter (Signed)
Please advise refill? LOV: 12/11/2020 NOV: 03/12/2021 Last refill: 05/23/2020 #90 with 1/refill

## 2021-03-06 ENCOUNTER — Other Ambulatory Visit: Payer: Self-pay | Admitting: Family Medicine

## 2021-03-06 NOTE — Telephone Encounter (Signed)
Copied from CRM [id number]. Topic: Quick Communication - Rx Refill/Question >> Mar 06, 2021  2:13 PM Jaquita Rector A wrote: Medication: oxyCODONE-acetaminophen (PERCOCET) 7.5-325 MG tablet Patient asking for this to be reduced to oxyCODONE-acetaminophen (PERCOCET) 5-325 MG tablet please advise  Has the patient contacted their pharmacy? No. (Agent: If no, request that the patient contact the pharmacy for the refill.) (Agent: If yes, when and what did the pharmacy advise?)  Preferred Pharmacy (with phone number or street name): [id number] [place], [place] - [address]  Phone:  [phone] Fax:  [phone]     Agent: Please be advised that RX refills may take up to 3 business days. We ask that you follow-up with your pharmacy.

## 2021-03-06 NOTE — Telephone Encounter (Signed)
  Notes to clinic:  Patient has appt on 03/12/2021 This refill cannot be delegated    Requested Prescriptions  Pending Prescriptions Disp Refills   oxyCODONE-acetaminophen (PERCOCET) 7.5-325 MG tablet 180 tablet 0    Sig: Take 1 tablet by mouth every 4 (four) hours as needed for severe pain.     There is no refill protocol information for this order

## 2021-03-09 ENCOUNTER — Encounter: Payer: Self-pay | Admitting: Family Medicine

## 2021-03-09 ENCOUNTER — Telehealth: Payer: Self-pay | Admitting: Physician Assistant

## 2021-03-09 MED ORDER — OXYCODONE-ACETAMINOPHEN 5-325 MG PO TABS
1.0000 | ORAL_TABLET | ORAL | 0 refills | Status: AC | PRN
Start: 2021-03-09 — End: ?

## 2021-03-09 NOTE — Telephone Encounter (Signed)
Copied from CRM [id number]. Topic: Quick Communication - Rx Refill/Question >> Mar 09, 2021 11:23 AM Gaetana Michaelis A wrote: Medication: oxyCODONE-acetaminophen (PERCOCET/ROXICET) 5-325 MG tablet [[id number]]   Has the patient contacted their pharmacy? No. (Agent: If no, request that the patient contact the pharmacy for the refill.) (Agent: If yes, when and what did the pharmacy advise?)  Preferred Pharmacy (with phone number or street name): [id number] [place], [place] - [address]  Phone:  [phone] Fax:  [phone]  Agent: Please be advised that RX refills may take up to 3 business days. We ask that you follow-up with your pharmacy.

## 2021-03-09 NOTE — Telephone Encounter (Signed)
Refill request forwarded to Dr. Beryle Flock.

## 2021-03-09 NOTE — Telephone Encounter (Signed)
Patient states 30 days would be 03/10/2021 due to her taking the pills on 7/18 and 31 days in July.    Patient did state she took a few more during the night because she was in pain.    Patient still requesting medication be sent in tomorrow.

## 2021-03-09 NOTE — Telephone Encounter (Signed)
Patient advised.

## 2021-03-09 NOTE — Telephone Encounter (Signed)
Greggory Stallion the fiance called again this afternoon requesting the pt's pain medication.  Called office and was  put on hold.  I advised Greggory Stallion the dr has the message. He said pt will probably come to the office when she gets off work at 4 pm.

## 2021-03-09 NOTE — Telephone Encounter (Signed)
Last filled 7/18. She is too early for a refill

## 2021-03-09 NOTE — Addendum Note (Signed)
Addended by: Erasmo Downer on: 03/09/2021 04:46 PM   Modules accepted: Orders

## 2021-03-09 NOTE — Telephone Encounter (Signed)
Pt will run out of medication today and wanted to see if Dr. B would refill the oxyCODONE-acetaminophen (PERCOCET) 7.5-325 MG tablet Patient asking for this to be reduced to oxyCODONE-acetaminophen (PERCOCET) 5-325 MG tablet/ please advise   [facility]  [id number] [place] [place] - [address]       Phone:             [phone] Fax:     [phone]

## 2021-03-09 NOTE — Telephone Encounter (Signed)
Patient came back into the office with boyfriend on speaker phone.  Stated she was standing her ground and not leaving until it was resolved and wanted to speak to [place] or a provider.  I told her they were with patients.  She proceeded to explain why she was not wrong about 30 days and got upset.  I asked her to set in the back so her information wasn't discussed for others to hear.  She stated she didn't care.  When I got her back in the room she continued to get upset about days.  Saying 8/16 was the refill day.  I told her Dr. Beryle Flock stated she was not sending in refill today because it was too soon and I sent a message back asking about tomorrow since it was 31 days in July.  Patient stated I didn't tell her I did that.  I explained I did that after she got upset and walked out of the office the first time.  I was trying to explain to her what we could do to help.  I also told the patient at the appt on the 8/18 Dr. Beryle Flock and her would be able to talk about pain management options.  She became upset and said she was told we would be able to handle in the office. I told her it is each providers decision on how to handle controlled substances.  She admitted she took more than she was supposed to and ran out.  Patient stated I was threatening her and she was recording me.  I told the patient I wasn't threatening her I was just letting her know it is the providers choice on what to prescribe or not.   Also, last month same thing happened and patient called a staff member on her cell phone.  She somehow looked her up and got her number through a mutual friend.

## 2021-03-12 ENCOUNTER — Ambulatory Visit: Payer: BC Managed Care – PPO | Admitting: Family Medicine

## 2021-03-12 ENCOUNTER — Encounter: Payer: Self-pay | Admitting: Family Medicine

## 2021-03-12 ENCOUNTER — Other Ambulatory Visit: Payer: Self-pay

## 2021-03-12 VITALS — BP 154/101 | HR 91 | Temp 98.0°F | Resp 16 | Wt 227.8 lb

## 2021-03-12 DIAGNOSIS — F411 Generalized anxiety disorder: Secondary | ICD-10-CM

## 2021-03-12 DIAGNOSIS — F1190 Opioid use, unspecified, uncomplicated: Secondary | ICD-10-CM | POA: Diagnosis not present

## 2021-03-12 DIAGNOSIS — M545 Low back pain, unspecified: Secondary | ICD-10-CM | POA: Diagnosis not present

## 2021-03-12 DIAGNOSIS — F331 Major depressive disorder, recurrent, moderate: Secondary | ICD-10-CM

## 2021-03-12 DIAGNOSIS — G8929 Other chronic pain: Secondary | ICD-10-CM

## 2021-03-12 NOTE — Assessment & Plan Note (Signed)
Chronic and treated with chronic opioids She is not on any other adjunct medications for pain control, such as Cymbalta, amitriptyline, gabapentin/Lyrica, NSAIDs Discussed risks of chronic opioids and importance of going back She has cut back from the 10 mg pills of Percocet to the 5 mg pills, but she does still receive a high number per month Discussed pain management referral for further management

## 2021-03-12 NOTE — Assessment & Plan Note (Signed)
Reports that this is stable Denies any SI Continue Wellbutrin and SSRI

## 2021-03-12 NOTE — Assessment & Plan Note (Addendum)
Discussed continuing SSRI and Wellbutrin Discussed risks of chronic benzo use, especially in the setting of chronic narcotic use No change medication today, but patient may benefit from psychiatry in the future Would also recommend therapy Stop phentermine as this is not appropriate for concomitant therapy with chronic benzos or as therapy for sinus headaches

## 2021-03-12 NOTE — Progress Notes (Signed)
Established patient visit   Patient: Casey Carter   DOB: April 04, 1976   45 y.o. Female  MRN: [id number] Visit Date: 03/12/2021  Today's healthcare provider: Shirlee Latch, MD   Chief Complaint  Patient presents with   Anxiety   Back Pain   Subjective  -------------------------------------------------------------------------------------------------------------------- Anxiety Patient reports no chest pain, dizziness, nausea, palpitations or shortness of breath.    Back Pain Pertinent negatives include no abdominal pain, chest pain, dysuria, fever, headaches, numbness, pelvic pain or weakness.   Anxiety, Follow-up  She was last seen for anxiety 3 months ago. Changes made at last visit include none. She is still taking 20 mg lexapro and 150 mg wellbutrin for her anxiety and depression. She begin this treatment because of her divorce. She compares this timeframe to a "shit-show."  She takes 0.5 mg Klonopin dependent on her anxiety level.   She reports excellent compliance with treatment. She reports excellent tolerance of treatment. She is not having side effects.   She feels her anxiety is severe and Worse since last visit.  Symptoms:Patient reports that she has had difficulty with concentration for several months and states that she was prescribed Phentermine to help with concentration and sinus headaches. She reports she hasn't taking phentermine in a while.   Yes chest pain Yes difficulty concentrating  No dizziness Yes fatigue  No feelings of losing control No insomnia  No irritable Yes palpitations  Yes panic attacks Yes racing thoughts  No shortness of breath Yes sweating  No tremors/shakes    GAD-7 Results No flowsheet data found.  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 Changes made at last visit include patient states that she has cut down her Oxycodone to 5mg .  She reports good compliance with treatment. She feels that condition is Unchanged. She is not having side effects.   She went to see Dr. but he was no longer acceping new patients.Upon her discussion with Dr. she began tapering her dosage from 10 to 7.5 mg.   She reports her goal is to limit her intake of oxycodone from 7 a day to 5 a day. However she reports taking more when she is on her period because she had endometriosis.   She takes aleve and tylenol when she runs out of oxycodone. She reports taking aleve and tylenol the last 3 days for pain. She is agreeable to a referral for pain management.   She was addressed for her behavior of harassing staff and calling staff on personal numbers throughout the day. She reports that the behavior was in reflection of a panic attack because of how she was treated. The encounter was documented and she was provided a separation letter.  -----------------------------------------------------------------------------------------     Medications: Outpatient Medications Prior to Visit  Medication Sig   albuterol (PROVENTIL HFA;VENTOLIN HFA) 108 (90 Base) MCG/ACT inhaler Inhale 2 puffs into the lungs every 6 (six) hours as needed for wheezing or shortness of breath.   amoxicillin-clavulanate (AUGMENTIN) 875-125 MG tablet Take 1 tablet by mouth 2 (two) times daily.   buPROPion (WELLBUTRIN XL) 150 MG 24 hr tablet TAKE 1 TABLET(150 MG) BY MOUTH DAILY   cephALEXin (KEFLEX) 500 MG capsule Take 1 capsule (500 mg total) by mouth 2 (two) times daily.  clonazePAM (KLONOPIN) 0.5 MG tablet Take 1 tablet (0.5 mg total) by mouth 2 (two) times daily as needed for anxiety.   cyclobenzaprine (FLEXERIL) 10 MG tablet Take 1 tablet (10 mg total) by mouth 3 (three) times  daily as needed for muscle spasms.   escitalopram (LEXAPRO) 20 MG tablet TAKE 1 TABLET(20 MG) BY MOUTH DAILY   etodolac (LODINE) 300 MG capsule Take 1 capsule (300 mg total) by mouth every 8 (eight) hours.   FLOVENT HFA 110 MCG/ACT inhaler INHALE 2 PUFFS BY MOUTH INTO THE LUNGS DAILY   meloxicam (MOBIC) 15 MG tablet TAKE 1 TABLET BY MOUTH EVERY DAY   montelukast (SINGULAIR) 10 MG tablet TAKE 1 TABLET BY MOUTH EVERYDAY AT BEDTIME   oxyCODONE-acetaminophen (PERCOCET/ROXICET) 5-325 MG tablet Take 1 tablet by mouth every 4 (four) hours as needed for severe pain.   pantoprazole (PROTONIX) 40 MG tablet Take 1 tablet (40 mg total) by mouth daily.   Vitamin D, Ergocalciferol, (DRISDOL) 1.25 MG (50000 UT) CAPS capsule TAKE 1 CAPSULE BY MOUTH EVERY 7 DAYS   [DISCONTINUED] phentermine (ADIPEX-P) 37.5 MG tablet Take 1 tablet (37.5 mg total) by mouth daily before breakfast.   No facility-administered medications prior to visit.    Review of Systems  Constitutional:  Negative for chills, fatigue and fever.  HENT:  Negative for ear pain, sinus pressure, sinus pain and sore throat.   Eyes:  Negative for pain and visual disturbance.  Respiratory:  Negative for cough, chest tightness, shortness of breath and wheezing.   Cardiovascular:  Negative for chest pain, palpitations and leg swelling.  Gastrointestinal:  Negative for abdominal pain, blood in stool, constipation, diarrhea, nausea and vomiting.  Genitourinary:  Negative for dysuria, flank pain, frequency, pelvic pain and urgency.  Musculoskeletal:  Positive for back pain.  Neurological:  Negative for dizziness, weakness, light-headedness, numbness and headaches.      Objective  -------------------------------------------------------------------------------------------------------------------- BP (!) 154/101   Pulse 91   Temp 98 F (36.7 C) (Oral)   Resp 16   Wt 227 lb 12.8 oz (103.3 kg)   SpO2 99%   BMI 35.68 kg/m     Physical Exam Vitals  reviewed.  Constitutional:      General: She is not in acute distress.    Appearance: Normal appearance. She is well-developed. She is not diaphoretic.     Comments: Fidgety/jittery   HENT:     Head: Normocephalic and atraumatic.  Eyes:     General: No scleral icterus.    Conjunctiva/sclera: Conjunctivae normal.  Neck:     Thyroid: No thyromegaly.  Cardiovascular:     Rate and Rhythm: Normal rate and regular rhythm.     Pulses: Normal pulses.     Heart sounds: Normal heart sounds. No murmur heard. Pulmonary:     Effort: Pulmonary effort is normal. No respiratory distress.     Breath sounds: Normal breath sounds. No wheezing, rhonchi or rales.  Musculoskeletal:     Cervical back: Neck supple.     Right lower leg: No edema.     Left lower leg: No edema.  Lymphadenopathy:     Cervical: No cervical adenopathy.  Skin:    General: Skin is warm and dry.     Findings: No rash.  Neurological:     Mental Status: She is alert and oriented to person, place, and time. Mental status is at baseline.  Psychiatric:        Mood and Affect: Mood normal.  Behavior: Behavior normal.     No results found for any visits on 03/12/21.  Assessment & Plan  ---------------------------------------------------------------------------------------------------------------------- Problem List Items Addressed This Visit       Other   Low back pain (Chronic)    Chronic and treated with chronic opioids She is not on any other adjunct medications for pain control, such as Cymbalta, amitriptyline, gabapentin/Lyrica, NSAIDs Discussed risks of chronic opioids and importance of going back She has cut back from the 10 mg pills of Percocet to the 5 mg pills, but she does still receive a high number per month Discussed pain management referral for further management      Relevant Orders   Ambulatory referral to Pain Clinic   MDD (major depressive disorder)    Reports that this is stable Denies any  SI Continue Wellbutrin and SSRI      GAD (generalized anxiety disorder) - Primary    Discussed continuing SSRI and Wellbutrin Discussed risks of chronic benzo use, especially in the setting of chronic narcotic use No change medication today, but patient may benefit from psychiatry in the future Would also recommend therapy Stop phentermine as this is not appropriate for concomitant therapy with chronic benzos or as therapy for sinus headaches      Other Visit Diagnoses     Chronic narcotic use       Relevant Orders   Ambulatory referral to Pain Clinic       Discussed with patient the inappropriate nature of her harassing staff, including looking up a staff member on Facebook and contacting her on her personal cell phone outside of work hours.  Patient will be dismissed from the practice effective today.  Discussed the policy to provide medications and acute visits for the next 30 days.  Discussed resources for finding new PCP.   No follow-ups on file.      I,Essence Turner,acting as a Neurosurgeon for Shirlee Latch, MD.,have documented all relevant documentation on the behalf of Shirlee Latch, MD,as directed by  Shirlee Latch, MD while in the presence of Shirlee Latch, MD.  I, Shirlee Latch, MD, have reviewed all documentation for this visit. The documentation on 03/12/21 for the exam, diagnosis, procedures, and orders are all accurate and complete.   Damin Salido, Marzella Schlein, MD, MPH [facility]

## 2021-03-12 NOTE — Telephone Encounter (Signed)
Patient will be dismissed from our clinic as of her OV on 8/18 (today) for harassment of our staff in the office and calling a CMA on her personal cell phone outside of the office as well. I will hand delivery her letter.

## 2021-03-17 ENCOUNTER — Other Ambulatory Visit: Payer: Self-pay | Admitting: Physician Assistant

## 2021-03-17 DIAGNOSIS — J3081 Allergic rhinitis due to animal (cat) (dog) hair and dander: Secondary | ICD-10-CM

## 2021-03-20 ENCOUNTER — Other Ambulatory Visit: Payer: Self-pay | Admitting: Family Medicine

## 2021-03-20 DIAGNOSIS — F32A Depression, unspecified: Secondary | ICD-10-CM

## 2021-03-26 ENCOUNTER — Telehealth: Payer: Self-pay

## 2021-03-26 ENCOUNTER — Encounter: Payer: Self-pay | Admitting: Physician Assistant

## 2021-03-26 NOTE — Telephone Encounter (Signed)
Copied from CRM [id number]. Topic: Medical Record Request - Patient ROI Request >> Mar 26, 2021 12:57 PM Traci Sermon wrote: Patient Name/DOB/MRN #: Casey Carter/[id number]  Requestor Name/Agency: Patient  Call Back #: [phone]  Information Requested: All office visit records   Route to [facility] for [place] [facility]. For all other clinics, route to the clinic's PEC Pool.

## 2021-03-26 NOTE — Telephone Encounter (Signed)
Spoke with pt and advised that she would need to come in the office and sign a ROI and since she is request entire chart the request would be processed via [facility]. Pt verbalized understanding.

## 2021-03-26 NOTE — Telephone Encounter (Signed)
Copied from CRM #[id number]. Topic: Medical Record Request - Patient ROI Request >> Mar 26, 2021 12:57 PM Todd, Faith N wrote: Patient Name/DOB/MRN #: Casey Carter/04/02/[id number]  Requestor Name/Agency: Patient  Call Back #: [phone]  Information Requested: All office visit records   Route to [facility] Pool for [place] clinics. For all other clinics, route to the clinic's PEC Pool. 

## 2021-03-26 NOTE — Telephone Encounter (Signed)
ERROR

## 2021-03-27 ENCOUNTER — Telehealth: Payer: Self-pay

## 2021-03-27 NOTE — Telephone Encounter (Signed)
Copied from CRM #[id number]. Topic: Medical Record Request - Patient ROI Request >> Mar 26, 2021 12:57 PM Todd, Faith N wrote: Patient Name/DOB/MRN #: Casey Carter/05/23/[id number]  Requestor Name/Agency: Patient  Call Back #: [phone]  Information Requested: All office visit records   Route to [facility] Pool for [place] clinics. For all other clinics, route to the clinic's PEC Pool. 

## 2021-05-08 ENCOUNTER — Other Ambulatory Visit: Payer: Self-pay | Admitting: Family Medicine

## 2021-05-08 DIAGNOSIS — K219 Gastro-esophageal reflux disease without esophagitis: Secondary | ICD-10-CM

## 2021-07-28 ENCOUNTER — Telehealth: Payer: Self-pay | Admitting: Physician Assistant

## 2021-07-28 NOTE — Telephone Encounter (Signed)
Patient has been dismissed from [facility]

## 2021-07-28 NOTE — Telephone Encounter (Signed)
[facility] faxed refill request for the following medications:   escitalopram (LEXAPRO) 20 MG tablet   Please advise.
